# Patient Record
Sex: Female | Born: 1960 | Race: Black or African American | Hispanic: No | Marital: Married | Smoking: Never smoker
Health system: Southern US, Community
[De-identification: ages and names within clinical notes are randomized; demographics above are authoritative.]

## PROBLEM LIST (undated history)

## (undated) DIAGNOSIS — G479 Sleep disorder, unspecified: Secondary | ICD-10-CM

## (undated) DIAGNOSIS — G4733 Obstructive sleep apnea (adult) (pediatric): Secondary | ICD-10-CM

## (undated) DIAGNOSIS — R0789 Other chest pain: Secondary | ICD-10-CM

## (undated) DIAGNOSIS — U071 COVID-19: Secondary | ICD-10-CM

## (undated) DIAGNOSIS — R062 Wheezing: Secondary | ICD-10-CM

## (undated) DIAGNOSIS — G4719 Other hypersomnia: Secondary | ICD-10-CM

## (undated) DIAGNOSIS — I83813 Varicose veins of bilateral lower extremities with pain: Secondary | ICD-10-CM

## (undated) DIAGNOSIS — M5136 Other intervertebral disc degeneration, lumbar region: Secondary | ICD-10-CM

## (undated) DIAGNOSIS — R413 Other amnesia: Secondary | ICD-10-CM

## (undated) DIAGNOSIS — M51369 Other intervertebral disc degeneration, lumbar region without mention of lumbar back pain or lower extremity pain: Secondary | ICD-10-CM

## (undated) DIAGNOSIS — M62838 Other muscle spasm: Secondary | ICD-10-CM

## (undated) DIAGNOSIS — M47819 Spondylosis without myelopathy or radiculopathy, site unspecified: Secondary | ICD-10-CM

## (undated) DIAGNOSIS — I10 Essential (primary) hypertension: Secondary | ICD-10-CM

## (undated) HISTORY — DX: Other amnesia: R41.3

## (undated) HISTORY — DX: Obstructive sleep apnea (adult) (pediatric): G47.33

## (undated) HISTORY — DX: COVID-19: U07.1

## (undated) HISTORY — DX: Spondylosis without myelopathy or radiculopathy, site unspecified: M47.819

## (undated) HISTORY — DX: Varicose veins of bilateral lower extremities with pain: I83.813

## (undated) HISTORY — DX: Other intervertebral disc degeneration, lumbar region without mention of lumbar back pain or lower extremity pain: M51.369

## (undated) HISTORY — DX: Other chest pain: R07.89

## (undated) HISTORY — DX: Other hypersomnia: G47.19

## (undated) HISTORY — DX: Other muscle spasm: M62.838

## (undated) HISTORY — DX: Sleep disorder, unspecified: G47.9

## (undated) HISTORY — DX: Other intervertebral disc degeneration, lumbar region: M51.36

## (undated) HISTORY — DX: Wheezing: R06.2

---

## 1999-01-08 ENCOUNTER — Other Ambulatory Visit: Admission: RE | Admit: 1999-01-08 | Discharge: 1999-01-08 | Payer: Self-pay

## 1999-01-17 ENCOUNTER — Ambulatory Visit (HOSPITAL_COMMUNITY): Admission: RE | Admit: 1999-01-17 | Discharge: 1999-01-17 | Payer: Self-pay | Admitting: Family Medicine

## 1999-01-18 ENCOUNTER — Encounter: Payer: Self-pay | Admitting: Family Medicine

## 2002-04-01 ENCOUNTER — Encounter: Payer: Self-pay | Admitting: Obstetrics

## 2002-04-01 ENCOUNTER — Ambulatory Visit (HOSPITAL_COMMUNITY): Admission: RE | Admit: 2002-04-01 | Discharge: 2002-04-01 | Payer: Self-pay | Admitting: Obstetrics

## 2002-04-08 ENCOUNTER — Encounter: Admission: RE | Admit: 2002-04-08 | Discharge: 2002-04-08 | Payer: Self-pay | Admitting: Obstetrics

## 2002-04-08 ENCOUNTER — Encounter: Payer: Self-pay | Admitting: Obstetrics

## 2002-05-03 ENCOUNTER — Emergency Department (HOSPITAL_COMMUNITY): Admission: EM | Admit: 2002-05-03 | Discharge: 2002-05-03 | Payer: Self-pay | Admitting: Emergency Medicine

## 2003-02-22 ENCOUNTER — Emergency Department (HOSPITAL_COMMUNITY): Admission: EM | Admit: 2003-02-22 | Discharge: 2003-02-22 | Payer: Self-pay

## 2003-11-28 ENCOUNTER — Observation Stay (HOSPITAL_COMMUNITY): Admission: EM | Admit: 2003-11-28 | Discharge: 2003-11-29 | Payer: Self-pay | Admitting: *Deleted

## 2003-11-29 ENCOUNTER — Encounter: Payer: Self-pay | Admitting: Cardiology

## 2003-12-22 ENCOUNTER — Encounter: Admission: RE | Admit: 2003-12-22 | Discharge: 2003-12-22 | Payer: Self-pay | Admitting: Family Medicine

## 2004-07-14 IMAGING — US US ABDOMEN COMPLETE
1 series · 14 of 25 positions shown · non-contrast
Comparison: none

CLINICAL DATA: Severe mid abdominal pain.  Questionable gallbladder disease.
 COMPLETE ABDOMINAL ULTRASOUND
 Multiple scans of the entire abdomen are made and show both kidneys to be somewhat echogenic without evidence of thinning of renal cortex.  Right kidney is normal in size measuring 11.0 cm. The left kidney is normal in size measuring 12.1 cm.  The abdominal aorta is normal at 2.0 cm.  Inferior vena cava is normal.  The spleen is normal at 8.4 cm.  The gallbladder is normal and has maximal thickness of the wall of 2.8 mm.  The common bile duct is normal measuring 3.7 mm.  The liver is normal.  Pancreas is normal. 
 IMPRESSION
 Somewhat echogenic kidneys bilaterally without evidence of mass, hydronephrosis, or calcification.  Otherwise normal complete abdomen.

[Series 1: unknown · 14 of 56 slices shown]
[im 1/56]
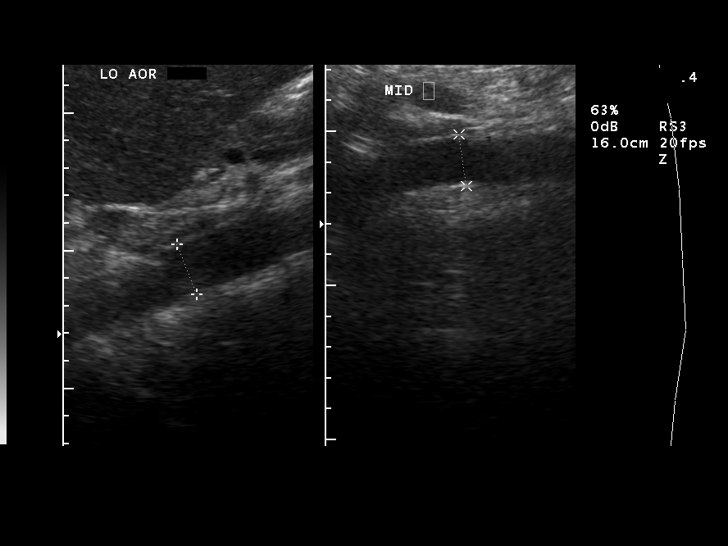
[im 5/56]
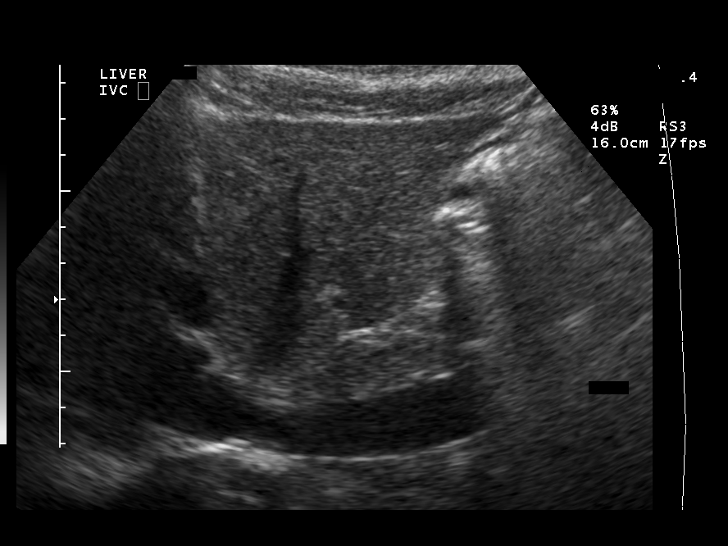
[im 10/56]
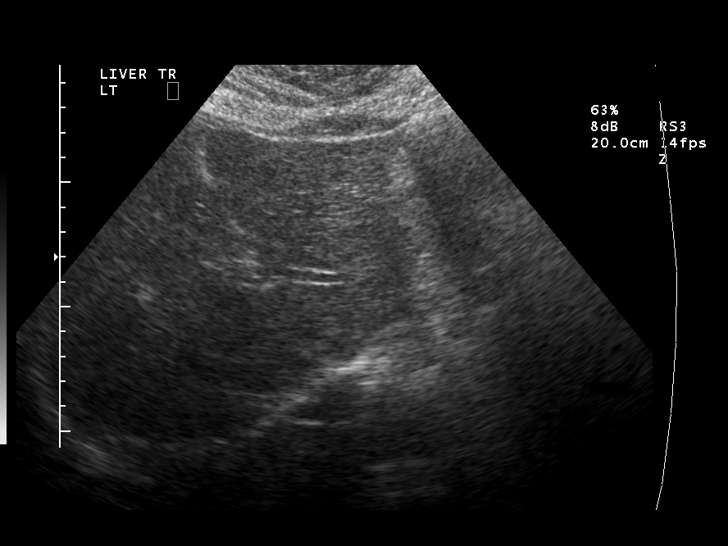
[im 14/56]
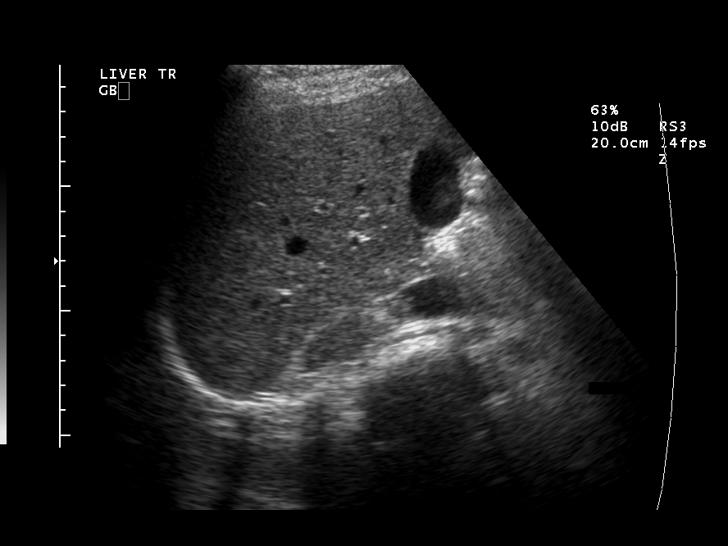
[im 19/56]
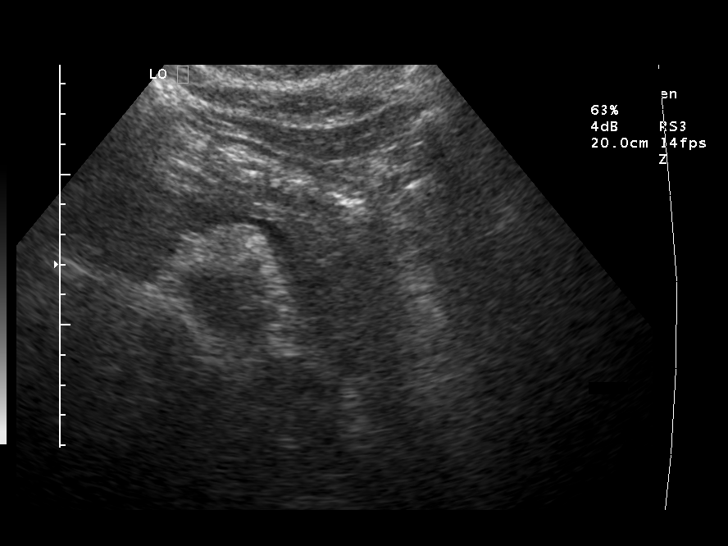
[im 21/56]
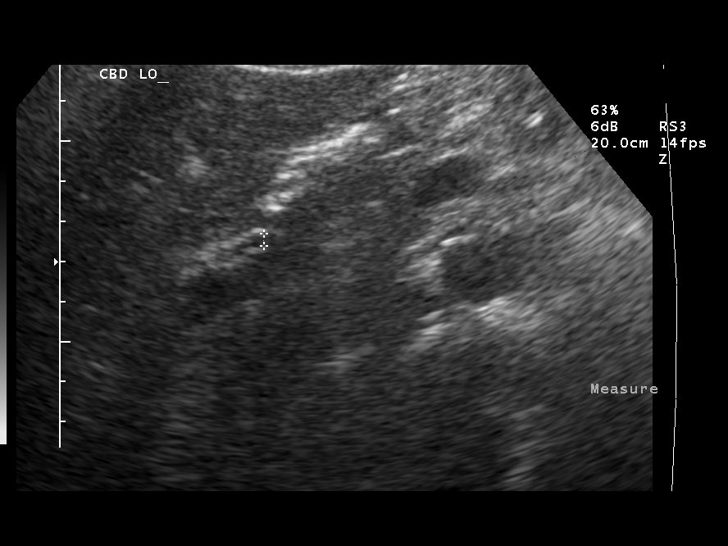
[im 26/56]
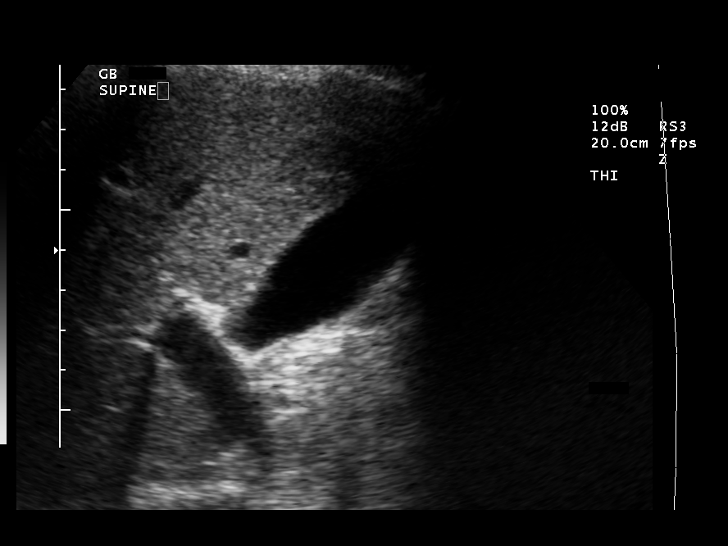
[im 30/56]
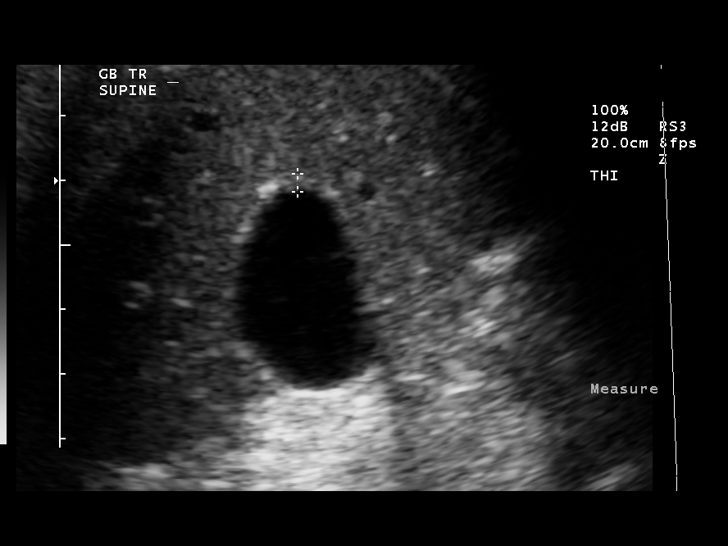
[im 35/56]
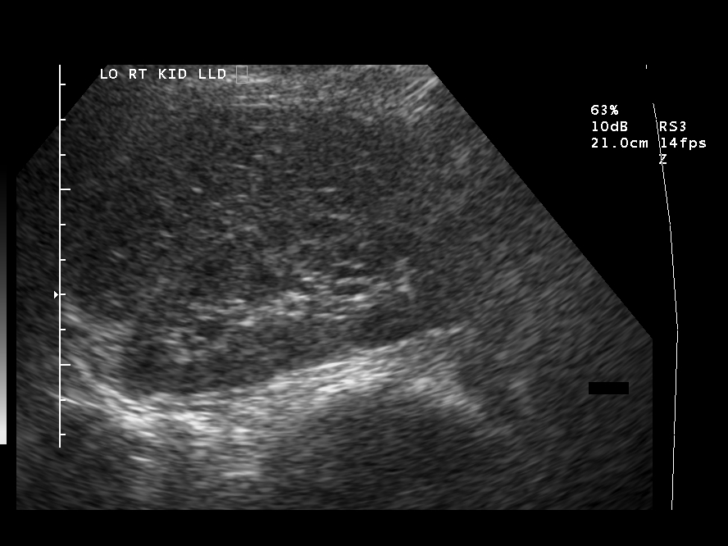
[im 37/56]
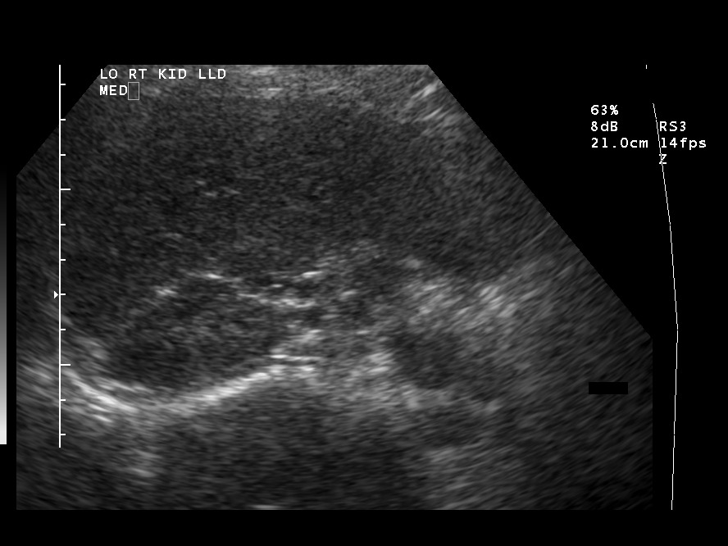
[im 42/56]
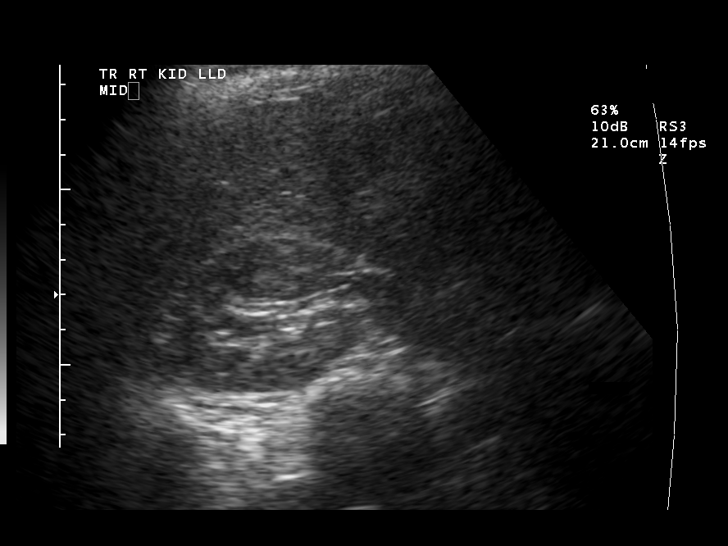
[im 46/56]
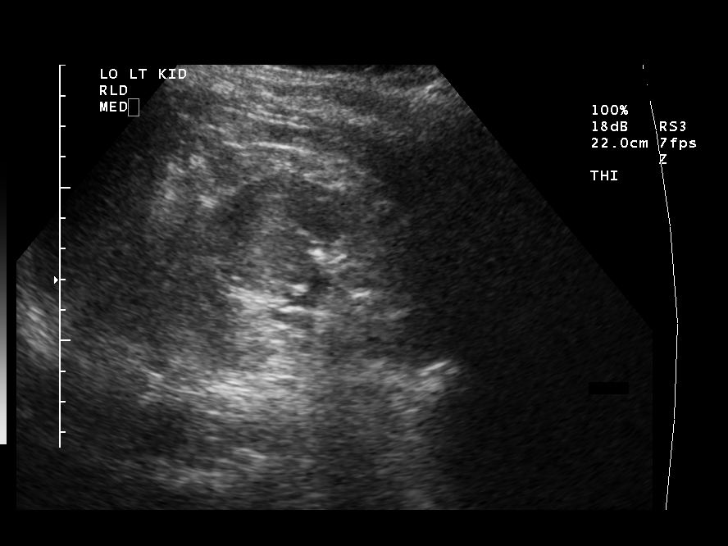
[im 51/56]
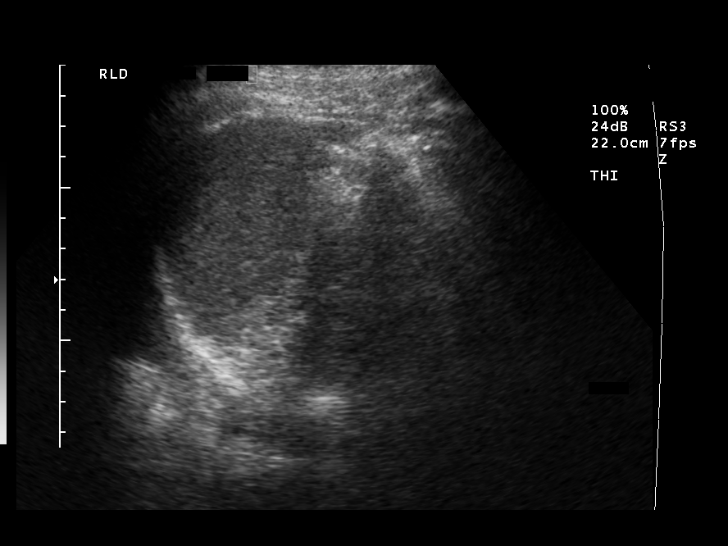
[im 56/56]
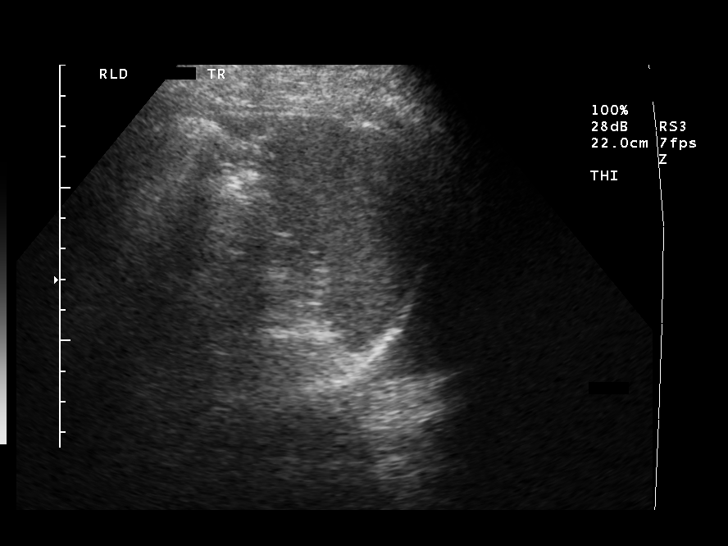

[14 of 25 positions shown; findings below may reference images not displayed]

## 2005-01-02 ENCOUNTER — Emergency Department (HOSPITAL_COMMUNITY): Admission: EM | Admit: 2005-01-02 | Discharge: 2005-01-02 | Payer: Self-pay | Admitting: Family Medicine

## 2006-04-01 ENCOUNTER — Ambulatory Visit: Payer: Self-pay | Admitting: Nurse Practitioner

## 2006-04-17 ENCOUNTER — Encounter: Admission: RE | Admit: 2006-04-17 | Discharge: 2006-04-17 | Payer: Self-pay | Admitting: Nurse Practitioner

## 2006-04-22 ENCOUNTER — Ambulatory Visit: Payer: Self-pay | Admitting: Nurse Practitioner

## 2006-04-22 ENCOUNTER — Ambulatory Visit: Payer: Self-pay | Admitting: *Deleted

## 2006-09-14 ENCOUNTER — Ambulatory Visit: Payer: Self-pay | Admitting: Nurse Practitioner

## 2006-09-17 ENCOUNTER — Ambulatory Visit (HOSPITAL_COMMUNITY): Admission: RE | Admit: 2006-09-17 | Discharge: 2006-09-17 | Payer: Self-pay | Admitting: Family Medicine

## 2006-09-17 ENCOUNTER — Encounter: Payer: Self-pay | Admitting: Vascular Surgery

## 2006-09-21 ENCOUNTER — Ambulatory Visit (HOSPITAL_COMMUNITY): Admission: RE | Admit: 2006-09-21 | Discharge: 2006-09-21 | Payer: Self-pay | Admitting: Nurse Practitioner

## 2006-10-08 ENCOUNTER — Ambulatory Visit: Payer: Self-pay | Admitting: Nurse Practitioner

## 2006-12-28 ENCOUNTER — Ambulatory Visit: Payer: Self-pay | Admitting: Nurse Practitioner

## 2007-02-12 ENCOUNTER — Ambulatory Visit: Payer: Self-pay | Admitting: Nurse Practitioner

## 2007-07-21 ENCOUNTER — Ambulatory Visit (HOSPITAL_BASED_OUTPATIENT_CLINIC_OR_DEPARTMENT_OTHER): Admission: RE | Admit: 2007-07-21 | Discharge: 2007-07-21 | Payer: Self-pay | Admitting: Orthopedic Surgery

## 2007-08-25 ENCOUNTER — Encounter (INDEPENDENT_AMBULATORY_CARE_PROVIDER_SITE_OTHER): Payer: Self-pay | Admitting: *Deleted

## 2007-09-06 ENCOUNTER — Ambulatory Visit: Payer: Self-pay | Admitting: Internal Medicine

## 2008-05-16 ENCOUNTER — Encounter (INDEPENDENT_AMBULATORY_CARE_PROVIDER_SITE_OTHER): Payer: Self-pay | Admitting: Nurse Practitioner

## 2008-05-16 ENCOUNTER — Ambulatory Visit: Payer: Self-pay | Admitting: Family Medicine

## 2008-05-16 LAB — CONVERTED CEMR LAB
Albumin: 4.2 g/dL (ref 3.5–5.2)
Alkaline Phosphatase: 64 units/L (ref 39–117)
CO2: 23 meq/L (ref 19–32)
Eosinophils Absolute: 0.1 10*3/uL (ref 0.0–0.7)
Glucose, Bld: 74 mg/dL (ref 70–99)
Lymphocytes Relative: 34 % (ref 12–46)
Lymphs Abs: 1.5 10*3/uL (ref 0.7–4.0)
Neutro Abs: 2.4 10*3/uL (ref 1.7–7.7)
Neutrophils Relative %: 56 % (ref 43–77)
Platelets: 221 10*3/uL (ref 150–400)
Potassium: 4.1 meq/L (ref 3.5–5.3)
Sodium: 138 meq/L (ref 135–145)
Total Protein: 7.2 g/dL (ref 6.0–8.3)
WBC: 4.3 10*3/uL (ref 4.0–10.5)

## 2008-05-22 ENCOUNTER — Ambulatory Visit (HOSPITAL_COMMUNITY): Admission: RE | Admit: 2008-05-22 | Discharge: 2008-05-22 | Payer: Self-pay | Admitting: Family Medicine

## 2008-08-08 ENCOUNTER — Ambulatory Visit: Payer: Self-pay | Admitting: Internal Medicine

## 2008-10-29 ENCOUNTER — Emergency Department (HOSPITAL_COMMUNITY): Admission: EM | Admit: 2008-10-29 | Discharge: 2008-10-29 | Payer: Self-pay | Admitting: Emergency Medicine

## 2010-07-17 ENCOUNTER — Emergency Department (HOSPITAL_COMMUNITY): Admission: EM | Admit: 2010-07-17 | Discharge: 2010-07-17 | Payer: Self-pay | Admitting: Family Medicine

## 2010-07-20 ENCOUNTER — Emergency Department (HOSPITAL_COMMUNITY): Admission: EM | Admit: 2010-07-20 | Discharge: 2010-07-20 | Payer: Self-pay | Admitting: Emergency Medicine

## 2010-07-20 ENCOUNTER — Emergency Department (HOSPITAL_COMMUNITY): Admission: EM | Admit: 2010-07-20 | Discharge: 2010-07-20 | Payer: Self-pay | Admitting: Family Medicine

## 2010-07-24 ENCOUNTER — Ambulatory Visit: Payer: Self-pay | Admitting: Internal Medicine

## 2010-08-15 ENCOUNTER — Ambulatory Visit: Payer: Self-pay | Admitting: Internal Medicine

## 2010-08-15 LAB — CONVERTED CEMR LAB
BUN: 24 mg/dL — ABNORMAL HIGH (ref 6–23)
Chloride: 106 meq/L (ref 96–112)
Creatinine, Ser: 1.02 mg/dL (ref 0.40–1.20)

## 2010-08-19 ENCOUNTER — Ambulatory Visit: Payer: Self-pay | Admitting: Internal Medicine

## 2010-12-29 ENCOUNTER — Encounter: Payer: Self-pay | Admitting: Family Medicine

## 2011-02-21 LAB — DIFFERENTIAL
Basophils Absolute: 0 10*3/uL (ref 0.0–0.1)
Basophils Relative: 0 % (ref 0–1)
Neutro Abs: 1.9 10*3/uL (ref 1.7–7.7)
Neutrophils Relative %: 50 % (ref 43–77)

## 2011-02-21 LAB — CBC
Hemoglobin: 12.4 g/dL (ref 12.0–15.0)
MCHC: 32.7 g/dL (ref 30.0–36.0)
Platelets: 198 10*3/uL (ref 150–400)
RDW: 13.8 % (ref 11.5–15.5)

## 2011-02-21 LAB — POCT CARDIAC MARKERS
Myoglobin, poc: 59.4 ng/mL (ref 12–200)
Myoglobin, poc: 61.7 ng/mL (ref 12–200)
Troponin i, poc: <0.05 ng/mL (ref 0.00–0.09)
Troponin i, poc: <0.05 ng/mL (ref 0.00–0.09)

## 2011-02-21 LAB — BASIC METABOLIC PANEL
BUN: 14 mg/dL (ref 6–23)
Calcium: 9.3 mg/dL (ref 8.4–10.5)
Creatinine, Ser: 0.79 mg/dL (ref 0.4–1.2)
GFR calc non Af Amer: >60 mL/min (ref >60)
Glucose, Bld: 94 mg/dL (ref 70–99)
Sodium: 139 mEq/L (ref 135–145)

## 2011-02-21 LAB — D-DIMER, QUANTITATIVE: D-Dimer, Quant: <0.22 ug/mL-FEU (ref 0.00–0.48)

## 2011-04-22 NOTE — Op Note (Signed)
NAMEDEVRA, STARE NO.:  [id number]   MEDICAL RECORD NO.:  [id number]          PATIENT TYPE:  AMB   LOCATION:  DSC                          FACILITY:  [facility]   PHYSICIAN:  Cindee Salt, M.D.       DATE OF BIRTH:  18-Jan-1961   DATE OF PROCEDURE:  07/21/2007  DATE OF DISCHARGE:                               OPERATIVE REPORT   PREOPERATIVE DIAGNOSIS:  Carpal tunnel syndrome right hand.   POSTOPERATIVE DIAGNOSIS:  Carpal tunnel syndrome right hand.   OPERATION:  Decompression right median nerve.   SURGEON:  Cindee Salt, M.D.   ASSISTANT:  Carolyne Fiscal R.N.   ANESTHESIA:  Forearm based IV regional.   ANESTHESIOLOGIST:  Guadalupe Maple, M.D.   HISTORY:  The patient is a 50 year old female referred by Duke Salvia  for consultation with respect to carpal tunnel syndrome, EMG nerve  conductions are positive.  This has not responded to conservative  treatment.  She has elect to proceed to have this surgically released.  She is aware of risks and complications including infection, recurrence,  injury to arteries, nerves, tendons incomplete relief of symptoms,  dystrophy.  Questions have been encouraged and answered to her  satisfaction.  In the preoperative area the patient is seen.  The  extremity marked by both the patient and surgeon, questions again  encouraged, antibiotic given.   PROCEDURE:  The patient is brought to the operating room where a forearm  based IV regional anesthetic was carried out without difficulty.  She  was prepped using DuraPrep, supine position, right arm free.  A  longitudinal incision was made, carried down through subcutaneous  tissue.  Bleeders were electrocauterized, palmar fascia was split,  superficial palmar arch identified, flexor tendon to the right little  finger identified to the ulnar side of median nerve.  Carpal retinaculum  was incised with sharp dissection.  A right-angle and Sewall retractor  were placed between skin and  forearm fascia.  The fascia released for  approximately 1.5 cm proximal to the wrist crease under direct vision.  The canal was explored.  Area compression to the nerve was apparent and  the tenosynovium tissue moderately thickened as was carpal retinaculum.  The wound was irrigated.  The skin was then closed interrupted 5-  0 Vicryl Rapide sutures.  Sterile compressive dressing and splint with  fingers free applied.  The patient tolerated the procedure well.  She is  taken to the recovery observation in satisfactory condition.  She is  discharged home to return to [facility] [place] in 1 week on  Vicodin.           ______________________________  Cindee Salt, M.D.     GK/MEDQ  D:  07/21/2007  T:  07/22/2007  Job:  [id number]   cc:   Duke Salvia

## 2011-04-25 NOTE — Discharge Summary (Signed)
Pamela Miles, Pamela Miles                        ACCOUNT NO.:  [id number]   MEDICAL RECORD NO.:  [id number]                   PATIENT TYPE:  OBV   LOCATION:  0382                                 FACILITY:  [facility]   PHYSICIAN:  Leonia Reeves, MD                 DATE OF BIRTH:  1961/07/24   DATE OF ADMISSION:  11/28/2003  DATE OF DISCHARGE:  11/29/2003                                 DISCHARGE SUMMARY   PRIMARY CARE PHYSICIAN:  None listed.   DISCHARGE DIAGNOSES:  1. Acute chest pain, unclear etiology.  2. Headache.   DISCHARGE MEDICATIONS:  1. Aspirin at 1 mg once daily.  2. Tylenol 650 mg q.4-6h. p.r.n. headache.   SUMMARY:  Pamela Miles is a 50 year old African American female with no  significant past medical history who presents in the ER with sudden onset of  left-sided chest pain radiating to the left arm.  The patient said the  problem started the previous night and chest pain was on and off. In the ER  the patient received sublingual nitroglycerin that she did not think  improved the chest pain, but which precipitated a headache to the patient.  She denied any history of fever, chills, cough, or sputum production. She  has no abdominal pain, no nausea, no vomiting, and no diarrhea.   Physical examination revealed a young [place] American female, well built,  well nourished, and in no acute distress. Blood pressure 140/85, pulse 73,  respirations 18, temperature 98.8. The patient was alert and oriented times  three. Head was normocephalic and atraumatic. Cardiovascular examination  revealed S1 and S2 normal. No S3, no S4. There was a soft grade 1/6 systolic  murmur that was nonradiating. Lungs were clear to auscultation and  percussion. The rest of the physical examination was unremarkable.   HOSPITAL COURSE:  The patient was admitted to the medical floor on a  telemetry bed and started on p.o. aspirin, low-dose Lopressor, Tylenol, and  also Protonix.  Cardiac enzymes were  drawn and EKGs were within normal  limits. Fasting blood sugar was also normal as well as CBC and BMP. Chest x-  ray showed no evidence of acute abnormality. A Cardiolite stress test was  done for further evaluation, the official report still pending at this time  though the patient said she was informed by the cardiologist that the test  was normal. The patient responded to the above regimen and the chest pain  resolved as well as the headache resolved.   DISPOSITION:  The patient is being discharged home today and has been  instructed to follow up with her primary care physician in seven to fourteen  days of discharge. The rationale for this discharge was discussed with  patient and she is agreeable.  Leonia Reeves, MD    VO/MEDQ  D:  11/29/2003  T:  11/29/2003  Job:  [id number]

## 2011-04-25 NOTE — H&P (Signed)
NAME:  Pamela Miles, Pamela Miles                        ACCOUNT NO.:  [id number]   MEDICAL RECORD NO.:  [id number]                   PATIENT TYPE:  EMS   LOCATION:  ED                                   FACILITY[facility]   PHYSICIAN:  Leonia Reeves, MD                 DATE OF BIRTH:  Jul 26, 1961   DATE OF ADMISSION:  11/28/2003  DATE OF DISCHARGE:                                HISTORY & PHYSICAL   PRIMARY CARE PHYSICIAN:  None listed.   HOSPITAL COURSE:  Chest pain.   HISTORY OF PRESENT ILLNESS:  Ms. Kiene is a 50 year old African-American  female with no significant past medical history who presents with sudden  onset of left-sided chest pain radiating to the left arm.  Patient said  problem started last night and chest pain has been on-and-off radiating to  the left arm.  She denied fever, chill, cough, and sputum production.  She  denied any trauma to the affected area.  There was no nausea, vomiting,  abdominal pain, and there was no diarrhea.  Patient denied history of such a  problem in the past.  She also denied history of heart attack in the family  except that she said the grandmother had a heart problem.  In ED the first  set of EKG and cardiac enzymes are within normal limits.   PAST MEDICAL HISTORY:  Non-significant.  Patient said she had laser surgery  for cancer of uterus.  She denied a history of hypertension,  hypercholesterolemia, diabetes mellitus, and also denied history of any  heart problem in the past.   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  She denied any drug abuse.  She specifically denied use of cocaine.  She  does not smoke and does not abuse alcohol.   ALLERGIES:  No known drug allergies.   FAMILY HISTORY:  Denied history of cardiac disease in the family.   CURRENT MEDICATIONS:  None listed.   REVIEW OF SYSTEMS:  CONSTITUTIONAL:  No fever.  Patient has headache which  she thinks is precipitated by nitroglycerin  that was given to her in the ED.  CARDIOVASCULAR:  Chest pain.  No shortness of breath.  No palpitation.  PULMONARY:  No cough, no sputum production, no dyspnea.  GIT:  No nausea, no  vomiting, no abdominal pain.  Other systems reviewed negative.   PHYSICAL EXAMINATION:  A young African-American female, well-built, well-  nourished, in no acute distress.  Blood pressure 140/85, pulse 73,  respiratory rate 18, temperature 98.8.  Patient is alert and oriented x3.  HEENT:  Head is normocephalic, atraumatic.  Pupils are equal, round,  reactive to light and accommodation.  Sclerae is anicteric.  Mucous  membranes moist.  Nares are patent and there is no oropharyngeal lesion.  NECK:  Supple without adenopathy.  CARDIOVASCULAR:  Normal S1 and S2, no S3, no S4.  No  rub.  There is soft  grade 1/6 systolic murmur.  RESPIRATORY:  Lungs are clear to auscultation and percussion.  Normal breath  sounds bilaterally.  CHEST WALL:  No chest wall tenderness.  GIT:  Abdomen is soft, nontender, no palpable mass.  Bowel sounds are  normal.  EXTREMITIES:  No edema, no cyanosis.  No __________.  There is normal range  of motion.  NEURO:  No focal neurological deficit.  Cranial nerves II-XII are grossly  intact.   LABORATORY DATA:  EKG and first set of cardiac enzymes are within normal  limits.  CBC, BMP, and urinalysis pending.   ASSESSMENT/PLAN:  A young African-American female with no known cardiac  history who now presents with left-sided chest pain of sudden onset.  The  first set of cardiac enzymes and EKG are within normal limits.  Patient to  be admitted to the telemetry for observation with the following diagnosis  and plan.   1. Acute chest pain.  Cardiac enzymes and EKG will be monitored.  Patient to     be started on low-dose Lopressor and aspirin.  If both cardiac enzymes     and EKG remain normal, patient will be scheduled for exercise stress     testing.   1. Headache.  Probably  precipitated by nitroglycerin.  Patient to be given     Tylenol.  Deep vein thrombosis prophylaxis will be done with subcutaneous     Lovenox and gastroesophageal prophylaxis will be done with p.o. Protonix.   The rationale for this admission for observation was discussed with the  patient and she is agreeable.                                               Leonia Reeves, MD    VO/MEDQ  D:  11/28/2003  T:  11/28/2003  Job:  [id number]

## 2011-07-31 ENCOUNTER — Other Ambulatory Visit (HOSPITAL_COMMUNITY): Payer: Self-pay | Admitting: Family Medicine

## 2011-07-31 ENCOUNTER — Other Ambulatory Visit: Payer: Self-pay | Admitting: Family Medicine

## 2011-07-31 DIAGNOSIS — Z1231 Encounter for screening mammogram for malignant neoplasm of breast: Secondary | ICD-10-CM

## 2011-08-06 ENCOUNTER — Ambulatory Visit (HOSPITAL_COMMUNITY): Payer: Self-pay | Attending: Family Medicine

## 2011-09-09 LAB — CBC
MCV: 88.2
Platelets: 218
RDW: 15
WBC: 5.3

## 2011-09-09 LAB — DIFFERENTIAL
Basophils Absolute: 0
Eosinophils Absolute: 0.1
Lymphocytes Relative: 35
Lymphs Abs: 1.8
Neutrophils Relative %: 53

## 2011-09-09 LAB — POCT I-STAT, CHEM 8
Creatinine, Ser: 1.1
Hemoglobin: 14.3
Potassium: 3.6
Sodium: 139

## 2011-09-09 LAB — POCT CARDIAC MARKERS
CKMB, poc: 5.7
Myoglobin, poc: 54.6
Myoglobin, poc: 72.4

## 2011-09-22 LAB — POCT HEMOGLOBIN-HEMACUE: Operator id: 112821

## 2012-02-17 ENCOUNTER — Emergency Department (HOSPITAL_COMMUNITY): Payer: Self-pay

## 2012-02-17 ENCOUNTER — Encounter (HOSPITAL_COMMUNITY): Payer: Self-pay

## 2012-02-17 ENCOUNTER — Emergency Department (HOSPITAL_COMMUNITY)
Admission: EM | Admit: 2012-02-17 | Discharge: 2012-02-17 | Disposition: A | Discharge status: Home / Self Care | Payer: Self-pay | Attending: Emergency Medicine | Admitting: Emergency Medicine

## 2012-02-17 DIAGNOSIS — M549 Dorsalgia, unspecified: Secondary | ICD-10-CM | POA: Insufficient documentation

## 2012-02-17 DIAGNOSIS — R059 Cough, unspecified: Secondary | ICD-10-CM | POA: Insufficient documentation

## 2012-02-17 DIAGNOSIS — I10 Essential (primary) hypertension: Secondary | ICD-10-CM | POA: Insufficient documentation

## 2012-02-17 DIAGNOSIS — J40 Bronchitis, not specified as acute or chronic: Secondary | ICD-10-CM | POA: Insufficient documentation

## 2012-02-17 DIAGNOSIS — R0602 Shortness of breath: Secondary | ICD-10-CM | POA: Insufficient documentation

## 2012-02-17 DIAGNOSIS — R05 Cough: Secondary | ICD-10-CM | POA: Insufficient documentation

## 2012-02-17 HISTORY — DX: Essential (primary) hypertension: I10

## 2012-02-17 MED ORDER — IBUPROFEN 800 MG PO TABS
800.0000 mg | ORAL_TABLET | Freq: Once | ORAL | Status: AC
  Administered 2012-02-17: 800 mg via ORAL
  Filled 2012-02-17: qty 1

## 2012-02-17 NOTE — ED Notes (Signed)
Patient reports that she developed cough, headache, chills x 3 days

## 2012-02-17 NOTE — ED Provider Notes (Signed)
Medical screening examination/treatment/procedure(s) were performed by non-physician practitioner and as supervising physician I was immediately available for consultation/collaboration.   Taylor Spilde, MD 02/17/12 1449 

## 2012-02-17 NOTE — Discharge Instructions (Signed)
Bronchitis Bronchitis is the body's way of reacting to injury and/or infection (inflammation) of the bronchi. Bronchi are the air tubes that extend from the windpipe into the lungs. If the inflammation becomes severe, it may cause shortness of breath. CAUSES  Inflammation may be caused by:  A virus.   Germs (bacteria).   Dust.   Allergens.   Pollutants and many other irritants.  The cells lining the bronchial tree are covered with tiny hairs (cilia). These constantly beat upward, away from the lungs, toward the mouth. This keeps the lungs free of pollutants. When these cells become too irritated and are unable to do their job, mucus begins to develop. This causes the characteristic cough of bronchitis. The cough clears the lungs when the cilia are unable to do their job. Without either of these protective mechanisms, the mucus would settle in the lungs. Then you would develop pneumonia. Smoking is a common cause of bronchitis and can contribute to pneumonia. Stopping this habit is the single most important thing you can do to help yourself. TREATMENT   Your caregiver may prescribe an antibiotic if the cough is caused by bacteria. Also, medicines that open up your airways make it easier to breathe. Your caregiver may also recommend or prescribe an expectorant. It will loosen the mucus to be coughed up. Only take over-the-counter or prescription medicines for pain, discomfort, or fever as directed by your caregiver.   Removing whatever causes the problem (smoking, for example) is critical to preventing the problem from getting worse.   Cough suppressants may be prescribed for relief of cough symptoms.   Inhaled medicines may be prescribed to help with symptoms now and to help prevent problems from returning.   For those with recurrent (chronic) bronchitis, there may be a need for steroid medicines.  SEEK IMMEDIATE MEDICAL CARE IF:   During treatment, you develop more pus-like mucus  (purulent sputum).   You have a fever.   Your baby is older than 3 months with a rectal temperature of 102 F (38.9 C) or higher.   Your baby is 87 months old or younger with a rectal temperature of 100.4 F (38 C) or higher.   You become progressively more ill.   You have increased difficulty breathing, wheezing, or shortness of breath.  It is necessary to seek immediate medical care if you are elderly or sick from any other disease. MAKE SURE YOU:   Understand these instructions.   Will watch your condition.   Will get help right away if you are not doing well or get worse.  Document Released: 11/24/2005 Document Revised: 11/13/2011 Document Reviewed: 10/03/2008 [facility] Patient Information 2012 [place], [place].         Antibiotic Nonuse  Your caregiver felt that the infection or problem was not one that would be helped with an antibiotic. Infections may be caused by viruses or bacteria. Only a caregiver can tell which one of these is the likely cause of an illness. A cold is the most common cause of infection in both adults and children. A cold is a virus. Antibiotic treatment will have no effect on a viral infection. Viruses can lead to many lost days of work caring for sick children and many missed days of school. Children may catch as many as 10 "colds" or "flus" per year during which they can be tearful, cranky, and uncomfortable. The goal of treating a virus is aimed at keeping the ill person comfortable. Antibiotics are medications used to help  the body fight bacterial infections. There are relatively few types of bacteria that cause infections but there are hundreds of viruses. While both viruses and bacteria cause infection they are very different types of germs. A viral infection will typically go away by itself within 7 to 10 days. Bacterial infections may spread or get worse without antibiotic treatment. Examples of bacterial infections are:  Sore throats (like strep  throat or tonsillitis).   Infection in the lung (pneumonia).   Ear and skin infections.  Examples of viral infections are:  Colds or flus.   Most coughs and bronchitis.   Sore throats not caused by Strep.   Runny noses.  It is often best not to take an antibiotic when a viral infection is the cause of the problem. Antibiotics can kill off the helpful bacteria that we have inside our body and allow harmful bacteria to start growing. Antibiotics can cause side effects such as allergies, nausea, and diarrhea without helping to improve the symptoms of the viral infection. Additionally, repeated uses of antibiotics can cause bacteria inside of our body to become resistant. That resistance can be passed onto harmful bacterial. The next time you have an infection it may be harder to treat if antibiotics are used when they are not needed. Not treating with antibiotics allows our own immune system to develop and take care of infections more efficiently. Also, antibiotics will work better for [place] when they are prescribed for bacterial infections. Treatments for a child that is ill may include:  Give extra fluids throughout the day to stay hydrated.   Get plenty of rest.   Only give your child over-the-counter or prescription medicines for pain, discomfort, or fever as directed by your caregiver.   The use of a cool mist humidifier may help stuffy noses.   Cold medications if suggested by your caregiver.  Your caregiver may decide to start you on an antibiotic if:  The problem you were seen for today continues for a longer length of time than expected.   You develop a secondary bacterial infection.  SEEK MEDICAL CARE IF:  Fever lasts longer than 5 days.   Symptoms continue to get worse after 5 to 7 days or become severe.   Difficulty in breathing develops.   Signs of dehydration develop (poor drinking, rare urinating, dark colored urine).   Changes in behavior or worsening tiredness  (listlessness or lethargy).  Document Released: 02/02/2002 Document Revised: 11/13/2011 Document Reviewed: 08/01/2009 [facility] Patient Information 2012 [place], [place].         Cool Mist Vaporizers Vaporizers may help relieve the symptoms of a cough and cold. By adding water to the air, mucus may become thinner and less sticky. This makes it easier to breathe and cough up secretions. Vaporizers have not been proven to show they help with colds. You should not use a vaporizer if you are allergic to mold. Cool mist vaporizers do not cause serious burns like hot mist vaporizers ("steamers"). HOME CARE INSTRUCTIONS  Follow the package instructions for your vaporizer.   Use a vaporizer that holds a large volume of water (1 to 2 gallons [5.7 to 7.5 liters]).   Do not use anything other than distilled water in the vaporizer.   Do not run the vaporizer all of the time. This can cause mold or bacteria to grow in the vaporizer.   Clean the vaporizer after each time you use it.   Clean and dry the vaporizer well before you store it.  Stop using a vaporizer if you develop worsening respiratory symptoms.  Document Released: 08/21/2004 Document Revised: 11/13/2011 Document Reviewed: 07/19/2009 [facility] Patient Information 2012 [place], [place].        Take ibuprofen as needed for your pain/chills.          If you have no primary doctor, here are some resources that may be helpful:  Medicaid-accepting [facility] Providers:   - [facility]- [address]      [phone]      Mon-Fri 9am-7pm, Sat 9am-1pm   - [facility]- [address] [place]      [phone]   - [facility]- [address], [address] [place]      [phone]   [facility]- [address]      [phone]   - Renaye Rakers- [address] [place], [address]      [phone]      Only accepts [place]  patients       after they have her name applied to their card   Self Pay (no insurance) in [place]:   - Sickle Cell Patients: Dr Willey Blade, [facility]      [address] [place]      [phone]   - Health [phone]   - Physician Referral Service- [phone]   - [facility]- [address] [place]      [phone]   [facility] [place]- [address]   - [facility]- see information above      (Speak to [employer] if you do not have insurance)   - [facility]- [address] [place]      [phone]   - [facility] [place]- [address]      [phone]   - [facility]- [address]      [phone]   - Dr Julio Sicks-  [address], [place]      [phone]   - [facility]- [address]      [phone]   - [facility]- [address]      [phone]      Also [address]      [phone]   - [facility]- [address]      [phone]      1st and 3rd Saturday every month, 10am-1pm Other agencies that provide inexpensive medical care:    [facility]  [phone]    [facility]  [phone]    [facility]  [phone]    [facility]  [phone]    [facility]  [phone]  General Information: Finding a doctor when you do not have health insurance can be tricky. Although you are not limited by an insurance plan, you are of course limited by her finances and how much but he can pay out of pocket.  What are your options if you don't have health insurance?   1) Find a Librarian, academic and Pay Out of Pocket Although you won't have to find out who is covered by your insurance plan, it is a good idea to ask around and get recommendations. You will then need to call the office and see if the doctor you have chosen will accept you as  a new patient and what types of options they offer for patients who are  self-pay. Some doctors offer discounts or will set up payment plans for their patients who do not have insurance, but you will need to ask so you aren't surprised when you get to your appointment.  2) Contact Your Local Health Department Not all health departments have doctors that can see patients for sick visits, but many do, so it is worth a call to see if yours does. If you don't know where your local health department is, you can check in your phone book. The CDC also has a tool to help you locate your state's health department, and many state websites also have listings of all of their local health departments.  3) Find a Walk-in Clinic If your illness is not likely to be very severe or complicated, you may want to try a walk in clinic. These are popping up all over the country in pharmacies, drugstores, and shopping centers. They're usually staffed by nurse practitioners or physician assistants that have been trained to treat common illnesses and complaints. They're usually fairly quick and inexpensive. However, if you have serious medical issues or chronic medical problems, these are probably not your best option

## 2012-02-17 NOTE — Progress Notes (Signed)
Pt listed as self pay with no insurance coverage Pt confirms he is self pay [place] resident with an [place] [employer] card that is effective until 03/10/12.  CM and [facility] coordinator spoke with her Pt offered [facility] services to assist with finding a [place] self pay provider

## 2012-02-17 NOTE — ED Provider Notes (Signed)
History     CSN: [id number]  Arrival date & time 02/17/12  1039   First MD Initiated Contact with Patient 02/17/12 1101      No chief complaint on file.   (Consider location/radiation/quality/duration/timing/severity/associated sxs/prior treatment) The history is provided by the patient.  51 y/o F with hx of HTN presents to ED with c/c of "flu symptoms" x 3 days. She has had chills, non-prod cough, mild SOB, right mid-back pain, and a mild HA. There has been no nasal congestion, ear pain, sore throat, neck pain or stiffness, CP, abd pain/ N/V, myalgias, dizziness, or lightheadedness. Pt has taken tylenol and aleve with temporary symptom relief. No prior medical eval for this complaint.  Past Medical History  Diagnosis Date  . Hypertension     History reviewed. No pertinent past surgical history.  No family history on file.  History  Substance Use Topics  . Smoking status: Never Smoker   . Smokeless tobacco: Not on file  . Alcohol Use: No     Review of Systems 10 systems reviewed and are negative for acute change except as noted in the HPI.  Allergies  Review of patient's allergies indicates no known allergies.  Home Medications   Current Outpatient Rx  Name Route Sig Dispense Refill  . ACETAMINOPHEN 500 MG PO TABS Oral Take 1,000 mg by mouth every 6 (six) hours as needed. pain    . GUAIFENESIN 100 MG/5ML PO SYRP Oral Take 200 mg by mouth 3 (three) times daily as needed. Chest congestion    . HYDROCHLOROTHIAZIDE 25 MG PO TABS Oral Take 25 mg by mouth daily.    Marland Kitchen VERAPAMIL HCL ER 240 MG PO TBCR Oral Take 240 mg by mouth at bedtime.      BP 121/69  Pulse 55  Temp 98.3 F (36.8 C)  Resp 16  SpO2 98%  Physical Exam  Nursing note and vitals reviewed. Constitutional: She is oriented to person, place, and time. She appears well-developed and well-nourished. No distress.  HENT:  Head: Normocephalic and atraumatic.  Right Ear: External ear normal.  Left Ear:  External ear normal.  Nose: Nose normal.  Mouth/Throat: Oropharynx is clear and moist. No oropharyngeal exudate.       Bilateral TM normal.  Eyes: Conjunctivae are normal. Pupils are equal, round, and reactive to light.  Neck: Normal range of motion. Neck supple.  Cardiovascular: Normal rate, regular rhythm, normal heart sounds and intact distal pulses.   Pulmonary/Chest: Breath sounds normal. No respiratory distress. She has no wheezes. She exhibits no tenderness.  Abdominal: Soft. Bowel sounds are normal. She exhibits no distension. There is no tenderness.  Musculoskeletal: Normal range of motion. She exhibits no edema.       Back:       Right lumbar back pain worse with trunk rotation, flexion, palpation. No midline tenderness, deformity, or palpable spasm.  Lymphadenopathy:    She has no cervical adenopathy.  Neurological: She is alert and oriented to person, place, and time. No cranial nerve deficit.  Skin: Skin is warm and dry.  Psychiatric: She has a normal mood and affect.    ED Course  Procedures (including critical care time)  Labs Reviewed - No data to display Dg Chest 2 View  02/17/2012  *RADIOLOGY REPORT*  Clinical Data: Cough and shortness of breath.  Chills.  CHEST - 2 VIEW  Comparison: 07/20/2010  Findings: The heart size and pulmonary vascularity are normal. Slight peribronchial thickening suggestive of bronchitis.  Lungs  are otherwise clear.  No effusions.  No osseous abnormality.  IMPRESSION: Minimal bronchitic changes.  Original Report Authenticated By: Gwynn Burly, M.D.     Dx 1: Bronchitis   MDM  CXR ordered to eval for pneumonia given chills, SOB, cough. Evidence of bronchitis. Back pain (which I suspect is muscular secondary to coughing) relieved with ibuprofen. Discussed symptomatic tx with patient, who voices understanding.        Shaaron Adler, [place] 02/17/12 1335

## 2012-03-24 ENCOUNTER — Emergency Department (HOSPITAL_COMMUNITY): Payer: No Typology Code available for payment source

## 2012-03-24 ENCOUNTER — Emergency Department (HOSPITAL_COMMUNITY)
Admission: EM | Admit: 2012-03-24 | Discharge: 2012-03-24 | Disposition: A | Discharge status: Home / Self Care | Payer: No Typology Code available for payment source | Attending: Emergency Medicine | Admitting: Emergency Medicine

## 2012-03-24 DIAGNOSIS — I10 Essential (primary) hypertension: Secondary | ICD-10-CM | POA: Insufficient documentation

## 2012-03-24 DIAGNOSIS — T148XXA Other injury of unspecified body region, initial encounter: Secondary | ICD-10-CM

## 2012-03-24 DIAGNOSIS — M25579 Pain in unspecified ankle and joints of unspecified foot: Secondary | ICD-10-CM | POA: Insufficient documentation

## 2012-03-24 DIAGNOSIS — S93699A Other sprain of unspecified foot, initial encounter: Secondary | ICD-10-CM | POA: Insufficient documentation

## 2012-03-24 DIAGNOSIS — M545 Low back pain, unspecified: Secondary | ICD-10-CM | POA: Insufficient documentation

## 2012-03-24 DIAGNOSIS — R51 Headache: Secondary | ICD-10-CM | POA: Insufficient documentation

## 2012-03-24 DIAGNOSIS — M542 Cervicalgia: Secondary | ICD-10-CM | POA: Insufficient documentation

## 2012-03-24 LAB — URINALYSIS, ROUTINE W REFLEX MICROSCOPIC
Ketones, ur: NEGATIVE mg/dL
Leukocytes, UA: NEGATIVE
Protein, ur: NEGATIVE mg/dL
Urobilinogen, UA: 1 mg/dL (ref 0.0–1.0)

## 2012-03-24 LAB — POCT PREGNANCY, URINE: Preg Test, Ur: NEGATIVE

## 2012-03-24 MED ORDER — CYCLOBENZAPRINE HCL 10 MG PO TABS
10.0000 mg | ORAL_TABLET | Freq: Once | ORAL | Status: AC
  Administered 2012-03-24: 10 mg via ORAL
  Filled 2012-03-24: qty 1

## 2012-03-24 MED ORDER — HYDROCODONE-ACETAMINOPHEN 5-325 MG PO TABS
1.0000 | ORAL_TABLET | Freq: Once | ORAL | Status: AC
  Administered 2012-03-24: 1 via ORAL
  Filled 2012-03-24: qty 1

## 2012-03-24 MED ORDER — CYCLOBENZAPRINE HCL 10 MG PO TABS: 10.0000 mg | ORAL_TABLET | Freq: Once | ORAL | Status: DC

## 2012-03-24 MED ORDER — HYDROCODONE-ACETAMINOPHEN 5-325 MG PO TABS: 1.0000 | ORAL_TABLET | Freq: Once | ORAL | Status: DC
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
## 2012-03-24 NOTE — ED Provider Notes (Signed)
Medical screening examination/treatment/procedure(s) were performed by non-physician practitioner and as supervising physician I was immediately available for consultation/collaboration.   Loren Racer, MD 03/24/12 2111
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
## 2012-03-24 NOTE — ED Notes (Signed)
Pt arrived [facility] on LSB and c-collar intact 
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
## 2012-03-28 ENCOUNTER — Encounter (HOSPITAL_COMMUNITY): Payer: Self-pay

## 2012-03-28 ENCOUNTER — Emergency Department (HOSPITAL_COMMUNITY)
Admission: EM | Admit: 2012-03-28 | Discharge: 2012-03-28 | Disposition: A | Discharge status: Home / Self Care | Payer: No Typology Code available for payment source | Attending: Emergency Medicine | Admitting: Emergency Medicine

## 2012-03-28 DIAGNOSIS — T148XXA Other injury of unspecified body region, initial encounter: Secondary | ICD-10-CM

## 2012-03-28 DIAGNOSIS — M542 Cervicalgia: Secondary | ICD-10-CM | POA: Insufficient documentation

## 2012-03-28 DIAGNOSIS — I10 Essential (primary) hypertension: Secondary | ICD-10-CM | POA: Insufficient documentation

## 2012-03-28 DIAGNOSIS — M545 Low back pain, unspecified: Secondary | ICD-10-CM | POA: Insufficient documentation

## 2012-03-28 MED ORDER — DIAZEPAM 5 MG PO TABS: 5.0000 mg | ORAL_TABLET | Freq: Two times a day (BID) | ORAL | Status: AC

## 2012-03-28 MED ORDER — OXYCODONE-ACETAMINOPHEN 5-325 MG PO TABS: 1.0000 | ORAL_TABLET | Freq: Four times a day (QID) | ORAL | Status: AC | PRN
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
## 2012-03-28 NOTE — Discharge Instructions (Signed)
Muscle Strain A muscle strain (pulled muscle) happens when a muscle is over-stretched. Recovery usually takes 5 to 6 weeks.  HOME CARE   Put ice on the injured area.   Put ice in a plastic bag.   Place a towel between your skin and the bag.   Leave the ice on for 15 to 20 minutes at a time, every hour for the first 2 days.   Do not use the muscle for several days or until your doctor says you can. Do not use the muscle if you have pain.   Wrap the injured area with an elastic bandage for comfort. Do not put it on too tightly.   Only take medicine as told by your doctor.   Warm up before exercise. This helps prevent muscle strains.  GET HELP RIGHT AWAY IF:  There is increased pain or puffiness (swelling) in the affected area. MAKE SURE YOU:   Understand these instructions.   Will watch your condition.   Will get help right away if you are not doing well or get worse.  Document Released: 09/02/2008 Document Revised: 11/13/2011 Document Reviewed: 09/02/2008 [employer] Patient Information 2012 [employer]. 
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
## 2012-04-27 ENCOUNTER — Ambulatory Visit (INDEPENDENT_AMBULATORY_CARE_PROVIDER_SITE_OTHER): Payer: PRIVATE HEALTH INSURANCE | Admitting: Family Medicine

## 2012-04-27 ENCOUNTER — Ambulatory Visit (HOSPITAL_BASED_OUTPATIENT_CLINIC_OR_DEPARTMENT_OTHER)
Admission: RE | Admit: 2012-04-27 | Discharge: 2012-04-27 | Disposition: A | Discharge status: Home / Self Care | Payer: No Typology Code available for payment source | Source: Ambulatory Visit | Attending: Family Medicine | Admitting: Family Medicine

## 2012-04-27 ENCOUNTER — Encounter: Payer: Self-pay | Admitting: Family Medicine

## 2012-04-27 VITALS — BP 124/78 | HR 60 | Temp 98.3°F | Ht 66.0 in | Wt 217.0 lb

## 2012-04-27 DIAGNOSIS — M549 Dorsalgia, unspecified: Secondary | ICD-10-CM

## 2012-04-27 DIAGNOSIS — S86012A Strain of left Achilles tendon, initial encounter: Secondary | ICD-10-CM

## 2012-04-27 DIAGNOSIS — M545 Low back pain, unspecified: Secondary | ICD-10-CM | POA: Insufficient documentation

## 2012-04-27 DIAGNOSIS — M542 Cervicalgia: Secondary | ICD-10-CM | POA: Insufficient documentation

## 2012-04-27 DIAGNOSIS — S3992XA Unspecified injury of lower back, initial encounter: Secondary | ICD-10-CM

## 2012-04-27 DIAGNOSIS — S0993XA Unspecified injury of face, initial encounter: Secondary | ICD-10-CM

## 2012-04-27 DIAGNOSIS — S199XXA Unspecified injury of neck, initial encounter: Secondary | ICD-10-CM

## 2012-04-27 DIAGNOSIS — IMO0002 Reserved for concepts with insufficient information to code with codable children: Secondary | ICD-10-CM

## 2012-04-27 DIAGNOSIS — S93499A Sprain of other ligament of unspecified ankle, initial encounter: Secondary | ICD-10-CM

## 2012-04-27 MED ORDER — HYDROCODONE-ACETAMINOPHEN 5-500 MG PO TABS: 1.0000 | ORAL_TABLET | Freq: Four times a day (QID) | ORAL | Status: AC | PRN

## 2012-04-27 MED ORDER — MELOXICAM 15 MG PO TABS: 15.0000 mg | ORAL_TABLET | Freq: Every day | ORAL | Status: DC

## 2012-04-27 MED ORDER — CYCLOBENZAPRINE HCL 10 MG PO TABS: 10.0000 mg | ORAL_TABLET | Freq: Three times a day (TID) | ORAL | Status: DC | PRN

## 2012-04-27 NOTE — Patient Instructions (Signed)
Given it's been over a month and you haven't made much improvement from your neck and back pain, we will move forward with MRIs to further assess for disc herniations. Continue working with Dr. Trey Paula in rehab, home program also. Take meloxicam daily with food for pain and inflammation. Vicodin as needed for severe pain (no driving on this medicine). Flexeril as needed for muscle spasms (no driving on this medicine if it makes you sleepy). Stay as active as possible. Strengthening of low back muscles, abdominal musculature are key for long term pain relief.  We will contact you with dates and times for the MRIs.  You strained your left achilles. Calf raises 3 sets of 10 once a day Can add heel walks, toe walks forward and backward as well Ice bucket 10-15 minutes at end of day - can ice 3-4 times a day. Avoid uneven ground, hills. Heel lifts will help unload this area during the day.  Follow up will be determined by your MRI results.

## 2012-04-29 ENCOUNTER — Encounter: Payer: Self-pay | Admitting: Family Medicine

## 2012-04-29 DIAGNOSIS — S86012A Strain of left Achilles tendon, initial encounter: Secondary | ICD-10-CM | POA: Insufficient documentation

## 2012-04-29 DIAGNOSIS — S199XXA Unspecified injury of neck, initial encounter: Secondary | ICD-10-CM | POA: Insufficient documentation

## 2012-04-29 DIAGNOSIS — S3992XA Unspecified injury of lower back, initial encounter: Secondary | ICD-10-CM | POA: Insufficient documentation
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
## 2012-05-27 ENCOUNTER — Emergency Department (HOSPITAL_BASED_OUTPATIENT_CLINIC_OR_DEPARTMENT_OTHER)
Admission: EM | Admit: 2012-05-27 | Discharge: 2012-05-27 | Disposition: A | Discharge status: Home / Self Care | Payer: No Typology Code available for payment source | Attending: Emergency Medicine | Admitting: Emergency Medicine

## 2012-05-27 ENCOUNTER — Encounter: Payer: Self-pay | Admitting: Family Medicine

## 2012-05-27 ENCOUNTER — Emergency Department (HOSPITAL_BASED_OUTPATIENT_CLINIC_OR_DEPARTMENT_OTHER): Payer: No Typology Code available for payment source

## 2012-05-27 ENCOUNTER — Encounter (HOSPITAL_BASED_OUTPATIENT_CLINIC_OR_DEPARTMENT_OTHER): Payer: Self-pay | Admitting: Family Medicine

## 2012-05-27 ENCOUNTER — Ambulatory Visit (INDEPENDENT_AMBULATORY_CARE_PROVIDER_SITE_OTHER): Payer: PRIVATE HEALTH INSURANCE | Admitting: Family Medicine

## 2012-05-27 VITALS — BP 127/82 | HR 65 | Temp 98.5°F | Ht 66.0 in | Wt 230.0 lb

## 2012-05-27 DIAGNOSIS — IMO0002 Reserved for concepts with insufficient information to code with codable children: Secondary | ICD-10-CM

## 2012-05-27 DIAGNOSIS — Z79899 Other long term (current) drug therapy: Secondary | ICD-10-CM | POA: Insufficient documentation

## 2012-05-27 DIAGNOSIS — S0993XA Unspecified injury of face, initial encounter: Secondary | ICD-10-CM

## 2012-05-27 DIAGNOSIS — R079 Chest pain, unspecified: Secondary | ICD-10-CM

## 2012-05-27 DIAGNOSIS — S3992XA Unspecified injury of lower back, initial encounter: Secondary | ICD-10-CM

## 2012-05-27 DIAGNOSIS — M542 Cervicalgia: Secondary | ICD-10-CM | POA: Insufficient documentation

## 2012-05-27 DIAGNOSIS — I10 Essential (primary) hypertension: Secondary | ICD-10-CM | POA: Insufficient documentation

## 2012-05-27 DIAGNOSIS — S199XXA Unspecified injury of neck, initial encounter: Secondary | ICD-10-CM

## 2012-05-27 DIAGNOSIS — Z87828 Personal history of other (healed) physical injury and trauma: Secondary | ICD-10-CM | POA: Insufficient documentation

## 2012-05-27 LAB — BASIC METABOLIC PANEL
CO2: 30 mEq/L (ref 19–32)
Calcium: 10.5 mg/dL (ref 8.4–10.5)
Chloride: 104 mEq/L (ref 96–112)
Potassium: 3.5 mEq/L (ref 3.5–5.1)
Sodium: 142 mEq/L (ref 135–145)

## 2012-05-27 LAB — CBC
Hemoglobin: 13.2 g/dL (ref 12.0–15.0)
MCH: 28.9 pg (ref 26.0–34.0)
Platelets: 244 10*3/uL (ref 150–400)
RBC: 4.56 MIL/uL (ref 3.87–5.11)
WBC: 5.3 10*3/uL (ref 4.0–10.5)

## 2012-05-27 LAB — TROPONIN I: Troponin I: <0.3 ng/mL (ref <0.30)

## 2012-05-27 MED ORDER — HYDROCODONE-ACETAMINOPHEN 5-325 MG PO TABS: 2.0000 | ORAL_TABLET | ORAL | Status: AC | PRN

## 2012-05-27 MED ORDER — IBUPROFEN 800 MG PO TABS: 800.0000 mg | ORAL_TABLET | Freq: Three times a day (TID) | ORAL | Status: DC
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
## 2012-05-27 NOTE — Patient Instructions (Addendum)
I recommend that you go to the emergency department to have your chest pain fully evaluated right now.  For your neck and low back continue the meloxicam with flexeril as needed for muscle spasms. Heat 15 minutes at a time 3-4 times a day. Keep doing your home exercises for low back and your neck at least 3 days a week until your pain has resolved. Massage and acupuncture are further considerations for your neck especially - they do massage in Dr. Lonell Face office down the hall on Tuesdays.  You can check with insurance to see if these are covered but they typically are not. You do have some arthritis in your low back that shots can be considered for - given this has largely improved I would not recommend that at this time - tylenol, meloxicam, flexeril, capsaicin (topical) are all medicines that can help with this without doing invasive treatment like the shots. It's ok for you to return to work but bend at knees when picking things up, keep computers at eye level, and use a chair with good back support. Follow up with me as needed.

## 2012-05-27 NOTE — ED Notes (Signed)
Pt brought down from Dr. Lazaro Arms office due to intermittent left sided chest pain since yesterday. Pt sts pain is sharp and only with movement or "real deep breath". Pt denies shob, n/v, cough.

## 2012-05-27 NOTE — ED Provider Notes (Signed)
Medical screening examination/treatment/procedure(s) were performed by non-physician practitioner and as supervising physician I was immediately available for consultation/collaboration.   Gwyneth Sprout, MD 05/27/12 [phone]

## 2012-05-27 NOTE — Discharge Instructions (Signed)
Chest Pain (Nonspecific) It is often hard to give a specific diagnosis for the cause of chest pain. There is always a chance that your pain could be related to something serious, such as a heart attack or a blood clot in the lungs. You need to follow up with your caregiver for further evaluation. CAUSES   Heartburn.   Pneumonia or bronchitis.   Anxiety or stress.   Inflammation around your heart (pericarditis) or lung (pleuritis or pleurisy).   A blood clot in the lung.   A collapsed lung (pneumothorax). It can develop suddenly on its own (spontaneous pneumothorax) or from injury (trauma) to the chest.   Shingles infection (herpes zoster virus).  The chest wall is composed of bones, muscles, and cartilage. Any of these can be the source of the pain.  The bones can be bruised by injury.   The muscles or cartilage can be strained by coughing or overwork.   The cartilage can be affected by inflammation and become sore (costochondritis).  DIAGNOSIS  Lab tests or other studies, such as X-rays, electrocardiography, stress testing, or cardiac imaging, may be needed to find the cause of your pain.  TREATMENT   Treatment depends on what may be causing your chest pain. Treatment may include:   Acid blockers for heartburn.   Anti-inflammatory medicine.   Pain medicine for inflammatory conditions.   Antibiotics if an infection is present.   You may be advised to change lifestyle habits. This includes stopping smoking and avoiding alcohol, caffeine, and chocolate.   You may be advised to keep your head raised (elevated) when sleeping. This reduces the chance of acid going backward from your stomach into your esophagus.   Most of the time, nonspecific chest pain will improve within 2 to 3 days with rest and mild pain medicine.  HOME CARE INSTRUCTIONS   If antibiotics were prescribed, take your antibiotics as directed. Finish them even if you start to feel better.   For the next few  days, avoid physical activities that bring on chest pain. Continue physical activities as directed.   Do not smoke.   Avoid drinking alcohol.   Only take over-the-counter or prescription medicine for pain, discomfort, or fever as directed by your caregiver.   Follow your caregiver's suggestions for further testing if your chest pain does not go away.   Keep any follow-up appointments you made. If you do not go to an appointment, you could develop lasting (chronic) problems with pain. If there is any problem keeping an appointment, you must call to reschedule.  SEEK MEDICAL CARE IF:   You think you are having problems from the medicine you are taking. Read your medicine instructions carefully.   Your chest pain does not go away, even after treatment.   You develop a rash with blisters on your chest.  SEEK IMMEDIATE MEDICAL CARE IF:   You have increased chest pain or pain that spreads to your arm, neck, jaw, back, or abdomen.   You develop shortness of breath, an increasing cough, or you are coughing up blood.   You have severe back or abdominal pain, feel nauseous, or vomit.   You develop severe weakness, fainting, or chills.   You have a fever.  THIS IS AN EMERGENCY. Do not wait to see if the pain will go away. Get medical help at once. Call your local emergency services (911 in [place] Do not drive yourself to the hospital. MAKE SURE YOU:   Understand these instructions.     Will watch your condition.   Will get help right away if you are not doing well or get worse.  Document Released: 09/03/2005 Document Revised: 11/13/2011 Document Reviewed: 06/29/2008 [employer] Patient Information 2012 [employer]. 

## 2012-05-28 ENCOUNTER — Encounter: Payer: Self-pay | Admitting: Family Medicine

## 2012-05-28 DIAGNOSIS — R079 Chest pain, unspecified: Secondary | ICD-10-CM | POA: Insufficient documentation

## 2012-05-28 DIAGNOSIS — R072 Precordial pain: Secondary | ICD-10-CM | POA: Insufficient documentation

## 2012-05-28 NOTE — Assessment & Plan Note (Signed)
Only risk factor for CAD is HTN but she was sitting on couch and had sudden onset left sided pain with shortness of breath and radiation to neck.  Taken down to ED after todays visit for full evaluation.
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
## 2012-06-29 ENCOUNTER — Encounter: Payer: Self-pay | Admitting: Family Medicine

## 2012-08-11 ENCOUNTER — Ambulatory Visit: Payer: Self-pay | Admitting: Family Medicine

## 2012-08-12 ENCOUNTER — Ambulatory Visit (INDEPENDENT_AMBULATORY_CARE_PROVIDER_SITE_OTHER): Payer: PRIVATE HEALTH INSURANCE | Admitting: Family Medicine

## 2012-08-12 VITALS — BP 112/73 | HR 65 | Ht 66.0 in | Wt 200.0 lb

## 2012-08-12 DIAGNOSIS — IMO0002 Reserved for concepts with insufficient information to code with codable children: Secondary | ICD-10-CM

## 2012-08-12 DIAGNOSIS — S199XXA Unspecified injury of neck, initial encounter: Secondary | ICD-10-CM

## 2012-08-12 DIAGNOSIS — S3992XA Unspecified injury of lower back, initial encounter: Secondary | ICD-10-CM

## 2012-08-12 DIAGNOSIS — R079 Chest pain, unspecified: Secondary | ICD-10-CM

## 2012-08-12 DIAGNOSIS — M25572 Pain in left ankle and joints of left foot: Secondary | ICD-10-CM

## 2012-08-12 DIAGNOSIS — S0993XA Unspecified injury of face, initial encounter: Secondary | ICD-10-CM

## 2012-08-12 DIAGNOSIS — M25579 Pain in unspecified ankle and joints of unspecified foot: Secondary | ICD-10-CM

## 2012-08-12 NOTE — Patient Instructions (Addendum)
You have left achilles tendinopathy Tylenol or aleve as needed for pain Lowering/raise on a step exercises 3 x 10 once or twice a day - two feet first then one (start with 3 x 6) Can add heel walks, toe walks forward and backward as well Ice bucket 10-15 minutes at end of day - can ice 3-4 times a day. Avoid uneven ground, hills as much as possible. Heel lifts  Consider nitro patches, physical therapy if not improving as expected. Follow up with me in 6 weeks.  For arthritis: Take tylenol 500mg  1-2 tabs three times a day for pain. Aleve 1-2 tabs twice a day with food Glucosamine sulfate 750mg  twice a day is a supplement that has been shown to help moderate to severe arthritis. Capsaicin topically up to four times a day may also help with pain.

## 2012-08-23 ENCOUNTER — Encounter: Payer: Self-pay | Admitting: Family Medicine

## 2012-08-23 DIAGNOSIS — M25572 Pain in left ankle and joints of left foot: Secondary | ICD-10-CM | POA: Insufficient documentation

## 2012-08-23 NOTE — Assessment & Plan Note (Signed)
2/2 achilles tendinopathy - Shown home exercise program and stretches.  Heel lifts provided.  Icing, tylenol as needed.  Avoid hills, uneven ground.  Consider PT, nitro patches if not improving as expected.
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
## 2012-08-23 NOTE — Assessment & Plan Note (Signed)
resolved 
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
## 2012-08-25 ENCOUNTER — Encounter (HOSPITAL_COMMUNITY): Payer: Self-pay | Admitting: Family Medicine

## 2012-08-25 ENCOUNTER — Emergency Department (HOSPITAL_COMMUNITY)
Admission: EM | Admit: 2012-08-25 | Discharge: 2012-08-25 | Disposition: A | Discharge status: Home / Self Care | Payer: Self-pay | Attending: Emergency Medicine | Admitting: Emergency Medicine

## 2012-08-25 DIAGNOSIS — Z833 Family history of diabetes mellitus: Secondary | ICD-10-CM | POA: Insufficient documentation

## 2012-08-25 DIAGNOSIS — I10 Essential (primary) hypertension: Secondary | ICD-10-CM | POA: Insufficient documentation

## 2012-08-25 DIAGNOSIS — R55 Syncope and collapse: Secondary | ICD-10-CM | POA: Insufficient documentation

## 2012-08-25 DIAGNOSIS — Z8241 Family history of sudden cardiac death: Secondary | ICD-10-CM | POA: Insufficient documentation

## 2012-08-25 DIAGNOSIS — M62838 Other muscle spasm: Secondary | ICD-10-CM | POA: Insufficient documentation

## 2012-08-25 DIAGNOSIS — Z8249 Family history of ischemic heart disease and other diseases of the circulatory system: Secondary | ICD-10-CM | POA: Insufficient documentation

## 2012-08-25 DIAGNOSIS — Z8489 Family history of other specified conditions: Secondary | ICD-10-CM | POA: Insufficient documentation

## 2012-08-25 LAB — POCT I-STAT, CHEM 8
BUN: 20 mg/dL (ref 6–23)
Hemoglobin: 13.6 g/dL (ref 12.0–15.0)
Potassium: 3.5 mEq/L (ref 3.5–5.1)
Sodium: 140 mEq/L (ref 135–145)
TCO2: 27 mmol/L (ref 0–100)

## 2012-08-25 NOTE — ED Notes (Signed)
Report given to Kim, RN.

## 2012-08-25 NOTE — ED Notes (Signed)
Pt has no complaints at this time but states that she had left leg cramping, hot flash with shortness of breath. Pt was breathing rapidly and then states she started having numbness and tingling in her hands. This episode lasted for approximately 30 minutes
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
## 2012-08-25 NOTE — ED Notes (Signed)
Old and new EKG given to Dr Yelverton 

## 2012-08-27 NOTE — ED Provider Notes (Signed)
History     CSN: [id number]  Arrival date & time 08/25/12  1842   First MD Initiated Contact with Patient 08/25/12 2131      Chief Complaint  Patient presents with  . Leg Pain  . Hot Flashes  . Blurred Vision    (Consider location/radiation/quality/duration/timing/severity/associated sxs/prior treatment) HPI Comments: Pamela Miles presents with sudden onset of left anterior thigh muscle cramping while she was sitting in her recliner chair at home.  She stood up to "walk off the cramp" and developed a hot flash (which she frequently has at least once daily associated with menopause) , she started taking very short, shallow breaths and then became lightheaded,  Her vision faded,  Her lips and finger tips became numb.  She sat down and her symptoms resolved.  The entire episode lasted about 15 minutes and occurred 3 hours before arrival here.  She has been symptom free since the event.  She denies having chest pain, cough, headache and has had no recent illness or fever.  She denies swelling or pain in her legs prior to the incident.  She does describe a history of frequent muscle spasm, but not in the past several months.  The history is provided by the patient.    Past Medical History  Diagnosis Date  . Hypertension     History reviewed. No pertinent past surgical history.  Family History  Problem Relation Age of Onset  . Hypertension Mother   . Sudden death Neg Hx   . Diabetes Neg Hx   . Heart attack Neg Hx   . Hyperlipidemia Neg Hx     History  Substance Use Topics  . Smoking status: Never Smoker   . Smokeless tobacco: Not on file  . Alcohol Use: No    OB History    Grav Para Term Preterm Abortions TAB SAB Ect Mult Living                  Review of Systems  Constitutional: Negative for fever.  HENT: Negative for congestion, sore throat and neck pain.   Eyes: Negative.   Respiratory: Negative for chest tightness.   Cardiovascular: Negative for chest pain,  palpitations and leg swelling.  Gastrointestinal: Negative for nausea and abdominal pain.  Genitourinary: Negative.   Musculoskeletal: Positive for myalgias. Negative for joint swelling and arthralgias.  Skin: Negative.  Negative for rash and wound.  Neurological: Positive for light-headedness and numbness. Negative for dizziness, weakness and headaches.  Hematological: Negative.   Psychiatric/Behavioral: Negative.     Allergies  Review of patient's allergies indicates no known allergies.  Home Medications   Current Outpatient Rx  Name Route Sig Dispense Refill  . Z-XTRA EX Oral Take 1 tablet by mouth daily.    Marland Kitchen HYDROCHLOROTHIAZIDE 25 MG PO TABS Oral Take 25 mg by mouth daily.    Marland Kitchen VERAPAMIL HCL ER 240 MG PO TBCR Oral Take 240 mg by mouth at bedtime.      BP 128/75  Pulse 63  Temp 97.8 F (36.6 C) (Oral)  Resp 21  SpO2 100%  Physical Exam  Nursing note and vitals reviewed. Constitutional: She is oriented to person, place, and time. She appears well-developed and well-nourished.  HENT:  Head: Normocephalic and atraumatic.  Eyes: Conjunctivae normal are normal.  Neck: Normal range of motion.  Cardiovascular: Normal rate, regular rhythm, normal heart sounds and intact distal pulses.   Pulses:      Dorsalis pedis pulses are 2+ on the  right side, and 2+ on the left side.  Pulmonary/Chest: Effort normal and breath sounds normal. She has no wheezes. She exhibits no tenderness.  Abdominal: Soft. Bowel sounds are normal. There is no tenderness.  Musculoskeletal: Normal range of motion. She exhibits no edema.       Left upper leg: She exhibits no tenderness, no swelling and no edema.  Neurological: She is alert and oriented to person, place, and time. No cranial nerve deficit or sensory deficit.  Reflex Scores:      Bicep reflexes are 2+ on the right side and 2+ on the left side.      Equal grip strength  Skin: Skin is warm and dry.  Psychiatric: She has a normal mood and  affect.    ED Course  Procedures (including critical care time)   Labs Reviewed  POCT I-STAT, CHEM 8  LAB REPORT - SCANNED   No results found.   1. Leg muscle spasm   2. Near syncope       MDM  Suspect pt had an episode of hyperventilation secondary to pain resulting in near syncope.  Labs,  Exam and ekg normal.  Pt vital signs stable and she remains asymptomatic while in ed.  No exam findings or risk factors for dvt/Pe.  Labs reviewed, no electrolyte abnormalities to explain muscle spasm, esp in light of pt on chronic diuretics.    Reassurance given,  Pt encouraged to f/u with pcp for a recheck or return here for worsened sx.       Date: 08/27/2012  Rate: 60  Rhythm: normal sinus rhythm  QRS Axis: normal  Intervals: normal  ST/T Wave abnormalities: normal  Conduction Disutrbances:none  Narrative Interpretation:   Old EKG Reviewed: unchanged    Burgess Amor, PA 08/27/12 1548

## 2012-08-31 NOTE — ED Provider Notes (Signed)
Medical screening examination/treatment/procedure(s) were performed by non-physician practitioner and as supervising physician I was immediately available for consultation/collaboration.   Loren Racer, MD 08/31/12 1705

## 2012-09-23 ENCOUNTER — Encounter: Payer: Self-pay | Admitting: Family Medicine

## 2012-09-23 ENCOUNTER — Ambulatory Visit: Payer: Self-pay | Admitting: Family Medicine

## 2012-09-23 ENCOUNTER — Ambulatory Visit (INDEPENDENT_AMBULATORY_CARE_PROVIDER_SITE_OTHER): Payer: PRIVATE HEALTH INSURANCE | Admitting: Family Medicine

## 2012-09-23 VITALS — BP 114/71 | HR 72 | Ht 66.0 in | Wt 200.0 lb

## 2012-09-23 DIAGNOSIS — S199XXA Unspecified injury of neck, initial encounter: Secondary | ICD-10-CM

## 2012-09-23 DIAGNOSIS — IMO0002 Reserved for concepts with insufficient information to code with codable children: Secondary | ICD-10-CM

## 2012-09-23 DIAGNOSIS — S0993XA Unspecified injury of face, initial encounter: Secondary | ICD-10-CM

## 2012-09-23 DIAGNOSIS — S3992XA Unspecified injury of lower back, initial encounter: Secondary | ICD-10-CM

## 2012-09-23 DIAGNOSIS — M25579 Pain in unspecified ankle and joints of unspecified foot: Secondary | ICD-10-CM

## 2012-09-23 DIAGNOSIS — M25572 Pain in left ankle and joints of left foot: Secondary | ICD-10-CM

## 2012-09-24 ENCOUNTER — Encounter: Payer: Self-pay | Admitting: Family Medicine
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
## 2012-09-24 NOTE — Assessment & Plan Note (Signed)
2/2 achilles tendinopathy - improved with HEP, heel lifts - given lower heel lifts to start using.  Consider PT, nitro patches in future but has had over 50% improvement with home program so will continue this until pain resolved.
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
## 2013-01-27 ENCOUNTER — Encounter: Payer: Self-pay | Admitting: Family Medicine

## 2013-01-27 ENCOUNTER — Ambulatory Visit (INDEPENDENT_AMBULATORY_CARE_PROVIDER_SITE_OTHER): Payer: PRIVATE HEALTH INSURANCE | Admitting: Family Medicine

## 2013-01-27 VITALS — BP 113/69 | HR 66 | Ht 66.0 in | Wt 207.0 lb

## 2013-01-27 DIAGNOSIS — M25572 Pain in left ankle and joints of left foot: Secondary | ICD-10-CM

## 2013-01-27 DIAGNOSIS — S3992XA Unspecified injury of lower back, initial encounter: Secondary | ICD-10-CM

## 2013-01-27 DIAGNOSIS — M7662 Achilles tendinitis, left leg: Secondary | ICD-10-CM

## 2013-01-27 DIAGNOSIS — M25579 Pain in unspecified ankle and joints of unspecified foot: Secondary | ICD-10-CM

## 2013-01-27 DIAGNOSIS — M7660 Achilles tendinitis, unspecified leg: Secondary | ICD-10-CM

## 2013-01-27 DIAGNOSIS — IMO0002 Reserved for concepts with insufficient information to code with codable children: Secondary | ICD-10-CM

## 2013-01-27 MED ORDER — NITROGLYCERIN 0.2 MG/HR TD PT24: MEDICATED_PATCH | TRANSDERMAL | Status: DC

## 2013-01-27 NOTE — Patient Instructions (Addendum)
Start physical therapy for your back and your achilles. Do home exercises on days you don't go to therapy. We will arrange for the injections in your low back and call you with date/time. Use 1/4th of a nitro patch over your left achilles.  Change this every day - take it off and put a new one on in a slightly different location. Follow up with me in 1 month.

## 2013-01-28 ENCOUNTER — Other Ambulatory Visit: Payer: Self-pay | Admitting: Family Medicine

## 2013-01-28 ENCOUNTER — Encounter: Payer: Self-pay | Admitting: Family Medicine

## 2013-01-28 DIAGNOSIS — M545 Low back pain, unspecified: Secondary | ICD-10-CM
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
## 2013-01-28 NOTE — Assessment & Plan Note (Signed)
2/2 achilles tendinopathy.  Continue HEP.  Add formal PT and nitro patches.  Discussed risks of headache, adhesive reaction.

## 2013-02-02 ENCOUNTER — Ambulatory Visit: Payer: No Typology Code available for payment source | Admitting: Physical Therapy

## 2013-02-03 ENCOUNTER — Ambulatory Visit
Admission: RE | Admit: 2013-02-03 | Discharge: 2013-02-03 | Disposition: A | Discharge status: Home / Self Care | Payer: No Typology Code available for payment source | Source: Ambulatory Visit | Attending: Family Medicine | Admitting: Family Medicine

## 2013-02-03 ENCOUNTER — Other Ambulatory Visit: Payer: Self-pay | Admitting: Family Medicine

## 2013-02-03 VITALS — BP 140/65 | HR 60

## 2013-02-03 DIAGNOSIS — M25572 Pain in left ankle and joints of left foot: Secondary | ICD-10-CM

## 2013-02-03 DIAGNOSIS — M545 Low back pain, unspecified: Secondary | ICD-10-CM

## 2013-02-03 MED ORDER — METHYLPREDNISOLONE ACETATE 40 MG/ML INJ SUSP (RADIOLOG
120.0000 mg | Freq: Once | INTRAMUSCULAR | Status: AC
  Administered 2013-02-03: 120 mg via INTRA_ARTICULAR

## 2013-02-03 MED ORDER — IOHEXOL 180 MG/ML  SOLN
1.0000 mL | Freq: Once | INTRAMUSCULAR | Status: AC | PRN
  Administered 2013-02-03: 1 mL via INTRA_ARTICULAR

## 2013-02-07 ENCOUNTER — Ambulatory Visit: Payer: No Typology Code available for payment source | Admitting: Physical Therapy

## 2013-02-15 ENCOUNTER — Ambulatory Visit: Payer: No Typology Code available for payment source | Attending: Family Medicine | Admitting: Physical Therapy

## 2013-02-15 DIAGNOSIS — M25579 Pain in unspecified ankle and joints of unspecified foot: Secondary | ICD-10-CM | POA: Insufficient documentation

## 2013-02-15 DIAGNOSIS — IMO0001 Reserved for inherently not codable concepts without codable children: Secondary | ICD-10-CM | POA: Insufficient documentation

## 2013-02-15 DIAGNOSIS — M542 Cervicalgia: Secondary | ICD-10-CM | POA: Insufficient documentation

## 2013-02-15 DIAGNOSIS — M545 Low back pain, unspecified: Secondary | ICD-10-CM | POA: Insufficient documentation

## 2013-02-18 ENCOUNTER — Ambulatory Visit: Payer: No Typology Code available for payment source | Attending: Family Medicine | Admitting: Physical Therapy

## 2013-02-18 DIAGNOSIS — M542 Cervicalgia: Secondary | ICD-10-CM | POA: Insufficient documentation

## 2013-02-18 DIAGNOSIS — M25579 Pain in unspecified ankle and joints of unspecified foot: Secondary | ICD-10-CM | POA: Insufficient documentation

## 2013-02-18 DIAGNOSIS — IMO0001 Reserved for inherently not codable concepts without codable children: Secondary | ICD-10-CM | POA: Insufficient documentation

## 2013-02-18 DIAGNOSIS — M545 Low back pain, unspecified: Secondary | ICD-10-CM | POA: Insufficient documentation

## 2013-02-22 ENCOUNTER — Ambulatory Visit: Payer: No Typology Code available for payment source | Attending: Family Medicine | Admitting: Physical Therapy

## 2013-02-22 DIAGNOSIS — M545 Low back pain, unspecified: Secondary | ICD-10-CM | POA: Insufficient documentation

## 2013-02-22 DIAGNOSIS — IMO0001 Reserved for inherently not codable concepts without codable children: Secondary | ICD-10-CM | POA: Insufficient documentation

## 2013-02-22 DIAGNOSIS — M25579 Pain in unspecified ankle and joints of unspecified foot: Secondary | ICD-10-CM | POA: Insufficient documentation

## 2013-02-22 DIAGNOSIS — M542 Cervicalgia: Secondary | ICD-10-CM | POA: Insufficient documentation

## 2013-02-23 ENCOUNTER — Ambulatory Visit (INDEPENDENT_AMBULATORY_CARE_PROVIDER_SITE_OTHER): Payer: PRIVATE HEALTH INSURANCE | Admitting: Family Medicine

## 2013-02-23 ENCOUNTER — Encounter: Payer: Self-pay | Admitting: Family Medicine

## 2013-02-23 VITALS — BP 110/70 | HR 64 | Ht 66.0 in | Wt 204.0 lb

## 2013-02-23 DIAGNOSIS — M25572 Pain in left ankle and joints of left foot: Secondary | ICD-10-CM

## 2013-02-23 DIAGNOSIS — M25579 Pain in unspecified ankle and joints of unspecified foot: Secondary | ICD-10-CM

## 2013-02-23 DIAGNOSIS — S199XXD Unspecified injury of neck, subsequent encounter: Secondary | ICD-10-CM

## 2013-02-23 DIAGNOSIS — Z5189 Encounter for other specified aftercare: Secondary | ICD-10-CM

## 2013-02-23 DIAGNOSIS — S3992XD Unspecified injury of lower back, subsequent encounter: Secondary | ICD-10-CM

## 2013-02-23 NOTE — Patient Instructions (Addendum)
You will probably need to do home exercises for your neck and back 2-3 times a week for the foreseeable future. Can continue with physical therapy as long as you're getting benefit with this. Continue with nitro patches for 2 more months - see me back at that time to reassess how you're doing. If your low back worsens you can do another series of shots in addition to your exercises - you can either call us to set that up or the radiologist directly if they gave you their contact information.

## 2013-02-24 ENCOUNTER — Encounter: Payer: Self-pay | Admitting: Family Medicine

## 2013-02-24 NOTE — Assessment & Plan Note (Signed)
2/2 muscle spasms of trapezius.  Heat, nsaids, HEP/PT, massage.
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
## 2013-02-24 NOTE — Assessment & Plan Note (Signed)
2/2 achilles tendinopathy.  Doing well with nitro patches - will continue through a 3 month course of this.  Continue PT and transition to a HEP.   F/u in 2 months.
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
## 2013-02-25 ENCOUNTER — Ambulatory Visit: Payer: No Typology Code available for payment source | Attending: Family Medicine | Admitting: Physical Therapy

## 2013-02-25 DIAGNOSIS — M545 Low back pain, unspecified: Secondary | ICD-10-CM | POA: Insufficient documentation

## 2013-02-25 DIAGNOSIS — M542 Cervicalgia: Secondary | ICD-10-CM | POA: Insufficient documentation

## 2013-02-25 DIAGNOSIS — IMO0001 Reserved for inherently not codable concepts without codable children: Secondary | ICD-10-CM | POA: Insufficient documentation

## 2013-02-25 DIAGNOSIS — M25579 Pain in unspecified ankle and joints of unspecified foot: Secondary | ICD-10-CM | POA: Insufficient documentation

## 2013-02-28 ENCOUNTER — Ambulatory Visit: Payer: No Typology Code available for payment source | Attending: Family Medicine | Admitting: Physical Therapy

## 2013-02-28 DIAGNOSIS — M25579 Pain in unspecified ankle and joints of unspecified foot: Secondary | ICD-10-CM | POA: Insufficient documentation

## 2013-02-28 DIAGNOSIS — M545 Low back pain, unspecified: Secondary | ICD-10-CM | POA: Insufficient documentation

## 2013-02-28 DIAGNOSIS — IMO0001 Reserved for inherently not codable concepts without codable children: Secondary | ICD-10-CM | POA: Insufficient documentation

## 2013-02-28 DIAGNOSIS — M542 Cervicalgia: Secondary | ICD-10-CM | POA: Insufficient documentation

## 2013-03-04 ENCOUNTER — Ambulatory Visit: Payer: No Typology Code available for payment source | Attending: Family Medicine | Admitting: Physical Therapy

## 2013-03-04 DIAGNOSIS — IMO0001 Reserved for inherently not codable concepts without codable children: Secondary | ICD-10-CM | POA: Insufficient documentation

## 2013-03-04 DIAGNOSIS — M545 Low back pain, unspecified: Secondary | ICD-10-CM | POA: Insufficient documentation

## 2013-03-04 DIAGNOSIS — M542 Cervicalgia: Secondary | ICD-10-CM | POA: Insufficient documentation

## 2013-03-04 DIAGNOSIS — M25579 Pain in unspecified ankle and joints of unspecified foot: Secondary | ICD-10-CM | POA: Insufficient documentation

## 2013-03-08 ENCOUNTER — Ambulatory Visit: Payer: No Typology Code available for payment source | Attending: Family Medicine | Admitting: Physical Therapy

## 2013-03-08 DIAGNOSIS — M545 Low back pain, unspecified: Secondary | ICD-10-CM | POA: Insufficient documentation

## 2013-03-08 DIAGNOSIS — M25579 Pain in unspecified ankle and joints of unspecified foot: Secondary | ICD-10-CM | POA: Insufficient documentation

## 2013-03-08 DIAGNOSIS — M542 Cervicalgia: Secondary | ICD-10-CM | POA: Insufficient documentation

## 2013-03-08 DIAGNOSIS — IMO0001 Reserved for inherently not codable concepts without codable children: Secondary | ICD-10-CM | POA: Insufficient documentation

## 2013-03-10 ENCOUNTER — Ambulatory Visit: Payer: No Typology Code available for payment source | Attending: Family Medicine | Admitting: Physical Therapy

## 2013-03-10 DIAGNOSIS — IMO0001 Reserved for inherently not codable concepts without codable children: Secondary | ICD-10-CM | POA: Insufficient documentation

## 2013-03-10 DIAGNOSIS — M545 Low back pain, unspecified: Secondary | ICD-10-CM | POA: Insufficient documentation

## 2013-03-10 DIAGNOSIS — M25579 Pain in unspecified ankle and joints of unspecified foot: Secondary | ICD-10-CM | POA: Insufficient documentation

## 2013-03-10 DIAGNOSIS — M542 Cervicalgia: Secondary | ICD-10-CM | POA: Insufficient documentation

## 2013-03-15 ENCOUNTER — Ambulatory Visit: Payer: No Typology Code available for payment source | Attending: Family Medicine | Admitting: Physical Therapy

## 2013-03-15 DIAGNOSIS — IMO0001 Reserved for inherently not codable concepts without codable children: Secondary | ICD-10-CM | POA: Insufficient documentation

## 2013-03-15 DIAGNOSIS — M545 Low back pain, unspecified: Secondary | ICD-10-CM | POA: Insufficient documentation

## 2013-03-15 DIAGNOSIS — M542 Cervicalgia: Secondary | ICD-10-CM | POA: Insufficient documentation

## 2013-03-15 DIAGNOSIS — M25579 Pain in unspecified ankle and joints of unspecified foot: Secondary | ICD-10-CM | POA: Insufficient documentation

## 2013-03-17 ENCOUNTER — Ambulatory Visit: Payer: No Typology Code available for payment source | Attending: Family Medicine | Admitting: Physical Therapy

## 2013-03-17 DIAGNOSIS — M25579 Pain in unspecified ankle and joints of unspecified foot: Secondary | ICD-10-CM | POA: Insufficient documentation

## 2013-03-17 DIAGNOSIS — IMO0001 Reserved for inherently not codable concepts without codable children: Secondary | ICD-10-CM | POA: Insufficient documentation

## 2013-03-17 DIAGNOSIS — M545 Low back pain, unspecified: Secondary | ICD-10-CM | POA: Insufficient documentation

## 2013-03-17 DIAGNOSIS — M542 Cervicalgia: Secondary | ICD-10-CM | POA: Insufficient documentation

## 2013-03-22 ENCOUNTER — Ambulatory Visit: Payer: No Typology Code available for payment source | Attending: Family Medicine | Admitting: Physical Therapy

## 2013-03-22 DIAGNOSIS — M545 Low back pain, unspecified: Secondary | ICD-10-CM | POA: Insufficient documentation

## 2013-03-22 DIAGNOSIS — IMO0001 Reserved for inherently not codable concepts without codable children: Secondary | ICD-10-CM | POA: Insufficient documentation

## 2013-03-22 DIAGNOSIS — M542 Cervicalgia: Secondary | ICD-10-CM | POA: Insufficient documentation

## 2013-03-22 DIAGNOSIS — M25579 Pain in unspecified ankle and joints of unspecified foot: Secondary | ICD-10-CM | POA: Insufficient documentation

## 2013-03-24 ENCOUNTER — Ambulatory Visit: Payer: No Typology Code available for payment source | Attending: Family Medicine | Admitting: Physical Therapy

## 2013-03-24 DIAGNOSIS — IMO0001 Reserved for inherently not codable concepts without codable children: Secondary | ICD-10-CM | POA: Insufficient documentation

## 2013-03-24 DIAGNOSIS — M545 Low back pain, unspecified: Secondary | ICD-10-CM | POA: Insufficient documentation

## 2013-03-24 DIAGNOSIS — M25579 Pain in unspecified ankle and joints of unspecified foot: Secondary | ICD-10-CM | POA: Insufficient documentation

## 2013-03-24 DIAGNOSIS — M542 Cervicalgia: Secondary | ICD-10-CM | POA: Insufficient documentation

## 2013-03-29 ENCOUNTER — Ambulatory Visit: Payer: No Typology Code available for payment source | Attending: Family Medicine | Admitting: Physical Therapy

## 2013-03-29 ENCOUNTER — Ambulatory Visit: Payer: No Typology Code available for payment source | Admitting: Physical Therapy

## 2013-03-29 DIAGNOSIS — M545 Low back pain, unspecified: Secondary | ICD-10-CM | POA: Insufficient documentation

## 2013-03-29 DIAGNOSIS — M542 Cervicalgia: Secondary | ICD-10-CM | POA: Insufficient documentation

## 2013-03-29 DIAGNOSIS — M25579 Pain in unspecified ankle and joints of unspecified foot: Secondary | ICD-10-CM | POA: Insufficient documentation

## 2013-03-29 DIAGNOSIS — IMO0001 Reserved for inherently not codable concepts without codable children: Secondary | ICD-10-CM | POA: Insufficient documentation

## 2013-04-01 ENCOUNTER — Ambulatory Visit: Payer: No Typology Code available for payment source | Attending: Family Medicine | Admitting: Physical Therapy

## 2013-04-01 DIAGNOSIS — M542 Cervicalgia: Secondary | ICD-10-CM | POA: Insufficient documentation

## 2013-04-01 DIAGNOSIS — M25579 Pain in unspecified ankle and joints of unspecified foot: Secondary | ICD-10-CM | POA: Insufficient documentation

## 2013-04-01 DIAGNOSIS — IMO0001 Reserved for inherently not codable concepts without codable children: Secondary | ICD-10-CM | POA: Insufficient documentation

## 2013-04-01 DIAGNOSIS — M545 Low back pain, unspecified: Secondary | ICD-10-CM | POA: Insufficient documentation

## 2013-04-05 ENCOUNTER — Ambulatory Visit: Payer: No Typology Code available for payment source | Admitting: Physical Therapy

## 2013-04-08 ENCOUNTER — Ambulatory Visit: Payer: No Typology Code available for payment source | Attending: Family Medicine | Admitting: Physical Therapy

## 2013-04-08 DIAGNOSIS — M25579 Pain in unspecified ankle and joints of unspecified foot: Secondary | ICD-10-CM | POA: Insufficient documentation

## 2013-04-08 DIAGNOSIS — IMO0001 Reserved for inherently not codable concepts without codable children: Secondary | ICD-10-CM | POA: Insufficient documentation

## 2013-04-08 DIAGNOSIS — M545 Low back pain, unspecified: Secondary | ICD-10-CM | POA: Insufficient documentation

## 2013-04-08 DIAGNOSIS — M542 Cervicalgia: Secondary | ICD-10-CM | POA: Insufficient documentation

## 2013-04-11 ENCOUNTER — Ambulatory Visit: Payer: No Typology Code available for payment source | Admitting: Physical Therapy

## 2013-04-14 ENCOUNTER — Ambulatory Visit: Payer: No Typology Code available for payment source | Admitting: Physical Therapy

## 2013-04-25 ENCOUNTER — Ambulatory Visit: Payer: Self-pay | Admitting: Family Medicine

## 2013-04-26 ENCOUNTER — Encounter: Payer: Self-pay | Admitting: Family Medicine

## 2013-04-26 ENCOUNTER — Ambulatory Visit (INDEPENDENT_AMBULATORY_CARE_PROVIDER_SITE_OTHER): Payer: PRIVATE HEALTH INSURANCE | Admitting: Family Medicine

## 2013-04-26 VITALS — BP 120/77 | HR 71 | Ht 66.0 in | Wt 200.0 lb

## 2013-04-26 DIAGNOSIS — M549 Dorsalgia, unspecified: Secondary | ICD-10-CM

## 2013-04-26 DIAGNOSIS — M7660 Achilles tendinitis, unspecified leg: Secondary | ICD-10-CM

## 2013-04-26 DIAGNOSIS — M25579 Pain in unspecified ankle and joints of unspecified foot: Secondary | ICD-10-CM

## 2013-04-26 DIAGNOSIS — M25572 Pain in left ankle and joints of left foot: Secondary | ICD-10-CM

## 2013-04-26 DIAGNOSIS — S199XXD Unspecified injury of neck, subsequent encounter: Secondary | ICD-10-CM

## 2013-04-26 DIAGNOSIS — M7662 Achilles tendinitis, left leg: Secondary | ICD-10-CM

## 2013-04-26 DIAGNOSIS — Z5189 Encounter for other specified aftercare: Secondary | ICD-10-CM

## 2013-04-26 MED ORDER — CYCLOBENZAPRINE HCL 10 MG PO TABS: 10.0000 mg | ORAL_TABLET | Freq: Three times a day (TID) | ORAL | Status: DC | PRN

## 2013-04-26 MED ORDER — NITROGLYCERIN 0.2 MG/HR TD PT24: MEDICATED_PATCH | TRANSDERMAL | Status: DC

## 2013-04-26 NOTE — Patient Instructions (Addendum)
Continue with home exercises for your achilles and your back. When you get cramps you should try drinking lots of water, try mustard, pickle juice, and/or pretzels. Flexeril as needed for cramps/spasms (can make you sleepy though). Stretch out the muscle that is cramping when this happens too. Try massage for your upper back. Follow up with me in 3 months.
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
## 2013-04-27 NOTE — Assessment & Plan Note (Signed)
2/2 muscle spasms of trapezius.  Heat, nsaids, HEP.  Encouraged she try massage, possibly active release.  No findings on MRI to warrant going ahead with surgical referral or ESIs.

## 2013-04-27 NOTE — Assessment & Plan Note (Signed)
2/2 achilles tendinopathy.  By further report she was using full patches for about a month.  Re-reviewed proper usage of 1/4th of a patch and plan to use for 3 months.  Heel lifts, home exercises.  If not improving would consider surgical referral.

## 2013-07-01 ENCOUNTER — Encounter: Payer: Self-pay | Admitting: Family Medicine

## 2013-07-27 ENCOUNTER — Ambulatory Visit (INDEPENDENT_AMBULATORY_CARE_PROVIDER_SITE_OTHER): Payer: PRIVATE HEALTH INSURANCE | Admitting: Family Medicine

## 2013-07-27 ENCOUNTER — Encounter: Payer: Self-pay | Admitting: Family Medicine

## 2013-07-27 VITALS — BP 125/77 | HR 68 | Ht 65.0 in | Wt 200.0 lb

## 2013-07-27 DIAGNOSIS — M25579 Pain in unspecified ankle and joints of unspecified foot: Secondary | ICD-10-CM

## 2013-07-27 DIAGNOSIS — S199XXD Unspecified injury of neck, subsequent encounter: Secondary | ICD-10-CM

## 2013-07-27 DIAGNOSIS — M549 Dorsalgia, unspecified: Secondary | ICD-10-CM

## 2013-07-27 DIAGNOSIS — M25572 Pain in left ankle and joints of left foot: Secondary | ICD-10-CM

## 2013-07-27 DIAGNOSIS — Z5189 Encounter for other specified aftercare: Secondary | ICD-10-CM

## 2013-07-27 MED ORDER — CYCLOBENZAPRINE HCL 10 MG PO TABS: 10.0000 mg | ORAL_TABLET | Freq: Three times a day (TID) | ORAL | Status: DC | PRN

## 2013-07-27 NOTE — Patient Instructions (Addendum)
Continue with home exercises for your achilles and your back. Flexeril as needed for cramps/spasms (can make you sleepy though). Consider active release for your upper back/neck - Thereasa Distance at [facility] is who I typically recommend - [phone] Call if you want to go ahead with surgery for your achilles. Use the patches usually for 3 more months (for the achilles). Follow up with me as needed.

## 2013-07-28 ENCOUNTER — Encounter: Payer: Self-pay | Admitting: Family Medicine

## 2013-07-28 NOTE — Assessment & Plan Note (Signed)
2/2 achilles tendinopathy.  We discussed she has tried all conservative measures that would help with this.  Offered surgical referral which she declined today - will call us if she changes her mind.  Can continue using nitro patches until she is 6 months out.  Heel lifts and home exercises as well.

## 2013-07-28 NOTE — Assessment & Plan Note (Signed)
2/2 muscle spasms of trapezius.  Heat, nsaids, HEP.  Again given number for active release to consider this.  No findings on MRI to warrant going ahead with surgical referral or ESIs.
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
## 2013-09-17 ENCOUNTER — Encounter (HOSPITAL_COMMUNITY): Payer: Self-pay | Admitting: Emergency Medicine

## 2013-09-17 ENCOUNTER — Emergency Department (HOSPITAL_COMMUNITY)
Admission: EM | Admit: 2013-09-17 | Discharge: 2013-09-18 | Disposition: A | Discharge status: Home / Self Care | Payer: Self-pay | Attending: Emergency Medicine | Admitting: Emergency Medicine

## 2013-09-17 DIAGNOSIS — R630 Anorexia: Secondary | ICD-10-CM | POA: Insufficient documentation

## 2013-09-17 DIAGNOSIS — Z79899 Other long term (current) drug therapy: Secondary | ICD-10-CM | POA: Insufficient documentation

## 2013-09-17 DIAGNOSIS — R05 Cough: Secondary | ICD-10-CM | POA: Insufficient documentation

## 2013-09-17 DIAGNOSIS — B9789 Other viral agents as the cause of diseases classified elsewhere: Secondary | ICD-10-CM | POA: Insufficient documentation

## 2013-09-17 DIAGNOSIS — I10 Essential (primary) hypertension: Secondary | ICD-10-CM | POA: Insufficient documentation

## 2013-09-17 DIAGNOSIS — R059 Cough, unspecified: Secondary | ICD-10-CM | POA: Insufficient documentation

## 2013-09-17 DIAGNOSIS — B349 Viral infection, unspecified: Secondary | ICD-10-CM

## 2013-09-17 LAB — RAPID STREP SCREEN (MED CTR MEBANE ONLY): Streptococcus, Group A Screen (Direct): NEGATIVE

## 2013-09-17 MED ORDER — IBUPROFEN 200 MG PO TABS
600.0000 mg | ORAL_TABLET | Freq: Once | ORAL | Status: AC
  Administered 2013-09-17: 600 mg via ORAL
  Filled 2013-09-17: qty 3

## 2013-09-17 NOTE — ED Notes (Signed)
Pt arrived to ED with a complaint of a sore throat and cough.  Pt has had symptoms since Tuesday.  Pt states that she has had diarrhea for two days.

## 2013-09-17 NOTE — ED Provider Notes (Signed)
CSN: [id number]     Arrival date & time 09/17/13  2207 History  This chart was scribed for non-physician practitioner, Earley Favor, FNP,working with Vida Roller, MD, by Karle Plumber, ED Scribe.  This patient was seen in room [facility] and the patient's care was started at 10:38 PM.  Chief Complaint  Patient presents with  . Sore Throat  . Cough   The history is provided by the patient. No language interpreter was used.   HPI Comments:  Pamela Miles is a 52 y.o. female who presents to the Emergency Department complaining of constant sore throat onset 4 days. She states she has been gargling warm salt water, drinking hot tea, and taking Tylenol X1,  2 days ago for the pain. She reports an associated sore throat and diarrhea yesterday and this morning but none since.  She reports 3-4 loose bowels last night and 1-2 today. She reports a decrease in appetite, but has been drinking plenty of fluids. Pt states her daughter recently had a sore throat and her granddaughter attends daycare. Pt denies rhinorrhea.   Past Medical History  Diagnosis Date  . Hypertension    History reviewed. No pertinent past surgical history. Family History  Problem Relation Age of Onset  . Hypertension Mother   . Sudden death Neg Hx   . Diabetes Neg Hx   . Heart attack Neg Hx   . Hyperlipidemia Neg Hx    History  Substance Use Topics  . Smoking status: Never Smoker   . Smokeless tobacco: Not on file  . Alcohol Use: No   OB History   Grav Para Term Preterm Abortions TAB SAB Ect Mult Living                 Review of Systems  Constitutional: Negative for fever and chills.  HENT: Positive for sore throat. Negative for postnasal drip and rhinorrhea.   Respiratory: Positive for cough. Negative for shortness of breath and wheezing.   Musculoskeletal: Negative for myalgias and neck pain.  All other systems reviewed and are negative.    Allergies  Review of patient's allergies indicates no known  allergies.  Home Medications   Current Outpatient Rx  Name  Route  Sig  Dispense  Refill  . acetaminophen (TYLENOL) 500 MG tablet   Oral   Take 500 mg by mouth every 6 (six) hours as needed for pain.         . cyclobenzaprine (FLEXERIL) 10 MG tablet   Oral   Take 10 mg by mouth 3 (three) times daily as needed for muscle spasms.         Marland Kitchen guaiFENesin (MUCINEX) 600 MG 12 hr tablet   Oral   Take 1,200 mg by mouth 2 (two) times daily as needed for congestion.         Marland Kitchen ibuprofen (ADVIL,MOTRIN) 200 MG tablet   Oral   Take 600 mg by mouth every 8 (eight) hours as needed for pain.         Marland Kitchen lisinopril-hydrochlorothiazide (PRINZIDE,ZESTORETIC) 20-25 MG per tablet   Oral   Take 1 tablet by mouth every morning.         . nitroGLYCERIN (NITRODUR - DOSED IN MG/24 HR) 0.2 mg/hr      Apply 1/2 of a patch to affected Achilles tendon - change daily   30 patch   1     Please cut each patch in half for patient.   . verapamil (CALAN-SR) 240 MG  CR tablet   Oral   Take 240 mg by mouth at bedtime.         Marland Kitchen ibuprofen (ADVIL,MOTRIN) 600 MG tablet   Oral   Take 1 tablet (600 mg total) by mouth every 6 (six) hours as needed for pain.   30 tablet   0    Triage Vitals: BP 129/75  Pulse 86  Temp(Src) 98.9 F (37.2 C) (Oral)  Resp 20  Wt 209 lb (94.802 kg)  BMI 34.78 kg/m2  SpO2 97% Physical Exam  Nursing note and vitals reviewed. Constitutional: She appears well-developed.  HENT:  Head: Normocephalic.  Eyes: Pupils are equal, round, and reactive to light.  Neck: Normal range of motion.  Cardiovascular: Normal rate and regular rhythm.   Pulmonary/Chest: Effort normal and breath sounds normal.  Musculoskeletal: Normal range of motion.  Lymphadenopathy:    She has no cervical adenopathy.  Neurological: She is alert.  Skin: Skin is warm. No rash noted.    ED Course  Procedures (including critical care time) DIAGNOSTIC STUDIES: Oxygen Saturation is 97% on RA, normal  by my interpretation.   COORDINATION OF CARE: 10:46 PM- Will do a strep test and give pain medication. Pt verbalizes understanding and agrees to plan.  Medications  ibuprofen (ADVIL,MOTRIN) tablet 600 mg (600 mg Oral Given 09/17/13 2251)   Labs Review Labs Reviewed  RAPID STREP SCREEN  CULTURE, GROUP A STREP   Imaging Review No results found.  EKG Interpretation   None       MDM   1. Viral syndrome        I personally performed the services described in this documentation, which was scribed in my presence. The recorded information has been reviewed and is accurate.   Arman Filter, NP 09/18/13 [phone]

## 2013-09-18 MED ORDER — IBUPROFEN 600 MG PO TABS: 600.0000 mg | ORAL_TABLET | Freq: Four times a day (QID) | ORAL | Status: DC | PRN

## 2013-09-18 NOTE — ED Provider Notes (Signed)
Medical screening examination/treatment/procedure(s) were performed by non-physician practitioner and as supervising physician I was immediately available for consultation/collaboration.  Shelden Raborn M Draysen Weygandt, MD 09/18/13 0301 

## 2013-09-20 LAB — CULTURE, GROUP A STREP: Culture: NO GROWTH

## 2013-09-22 ENCOUNTER — Emergency Department (HOSPITAL_COMMUNITY)
Admission: EM | Admit: 2013-09-22 | Discharge: 2013-09-22 | Disposition: A | Discharge status: Home / Self Care | Payer: Self-pay | Attending: Emergency Medicine | Admitting: Emergency Medicine

## 2013-09-22 ENCOUNTER — Encounter (HOSPITAL_COMMUNITY): Payer: Self-pay | Admitting: Emergency Medicine

## 2013-09-22 DIAGNOSIS — J069 Acute upper respiratory infection, unspecified: Secondary | ICD-10-CM | POA: Insufficient documentation

## 2013-09-22 DIAGNOSIS — R059 Cough, unspecified: Secondary | ICD-10-CM | POA: Insufficient documentation

## 2013-09-22 DIAGNOSIS — R0982 Postnasal drip: Secondary | ICD-10-CM | POA: Insufficient documentation

## 2013-09-22 DIAGNOSIS — Z79899 Other long term (current) drug therapy: Secondary | ICD-10-CM | POA: Insufficient documentation

## 2013-09-22 DIAGNOSIS — R05 Cough: Secondary | ICD-10-CM | POA: Insufficient documentation

## 2013-09-22 DIAGNOSIS — I10 Essential (primary) hypertension: Secondary | ICD-10-CM | POA: Insufficient documentation

## 2013-09-22 LAB — RAPID STREP SCREEN (MED CTR MEBANE ONLY): Streptococcus, Group A Screen (Direct): NEGATIVE

## 2013-09-22 MED ORDER — HYDROCOD POLST-CHLORPHEN POLST 10-8 MG/5ML PO LQCR: 5.0000 mL | Freq: Two times a day (BID) | ORAL | Status: DC | PRN

## 2013-09-22 MED ORDER — HYDROCODONE-HOMATROPINE 5-1.5 MG/5ML PO SYRP: 5.0000 mL | ORAL_SOLUTION | Freq: Four times a day (QID) | ORAL | Status: DC | PRN

## 2013-09-22 NOTE — Progress Notes (Signed)
[facility] provided pt with a list of primary care resources. Patient stated that she had filled out information on Affordable Care Act and was waiting to hear back.

## 2013-09-22 NOTE — ED Notes (Addendum)
Pt states that she has been having sore throat for the past few days not feeling well was seen on the 11 for the samething and tested for strep.

## 2013-09-22 NOTE — ED Provider Notes (Signed)
CSN: [id number]     Arrival date & time 09/22/13  1216 History   First MD Initiated Contact with Patient 09/22/13 1248     Chief Complaint  Patient presents with  . Sore Throat   (Consider location/radiation/quality/duration/timing/severity/associated sxs/prior Treatment) HPI Comments: Patient presents today with a chief complaint of sore throat.  Sore throat has been present for the past 1.5-2 weeks.  She was seen in the ED five days ago for the same and had a negative rapid strep done at that time.  She reports that she also has an associated dry cough and post nasal drip.  Sore throat is worsened with swallowing and also coughing.  She has taken Children's Indoor/Outdoor Allergy relief medication, Ibuprofen 600 mg, and Mucinex for her symptoms without relief.  She denies any difficulty swallowing.  Denies fever or chills.  Denies chest pain or SOB.  Denies sinus pain or pressure.  Denies nasal congestion.  Patient is a 52 y.o. female presenting with pharyngitis. The history is provided by the patient.  Sore Throat Associated symptoms include coughing and a sore throat. Pertinent negatives include no chills, congestion, fever, nausea or vomiting.    Past Medical History  Diagnosis Date  . Hypertension    History reviewed. No pertinent past surgical history. Family History  Problem Relation Age of Onset  . Hypertension Mother   . Sudden death Neg Hx   . Diabetes Neg Hx   . Heart attack Neg Hx   . Hyperlipidemia Neg Hx    History  Substance Use Topics  . Smoking status: Never Smoker   . Smokeless tobacco: Not on file  . Alcohol Use: No   OB History   Grav Para Term Preterm Abortions TAB SAB Ect Mult Living                 Review of Systems  Constitutional: Negative for fever and chills.  HENT: Positive for postnasal drip and sore throat. Negative for congestion, sinus pressure and trouble swallowing.   Respiratory: Positive for cough.   Gastrointestinal: Negative for  nausea and vomiting.  All other systems reviewed and are negative.    Allergies  Review of patient's allergies indicates no known allergies.  Home Medications   Current Outpatient Rx  Name  Route  Sig  Dispense  Refill  . acetaminophen (TYLENOL) 500 MG tablet   Oral   Take 500 mg by mouth every 6 (six) hours as needed for pain.         . cetirizine (ZYRTEC) 10 MG tablet   Oral   Take 10 mg by mouth daily.         . cyclobenzaprine (FLEXERIL) 10 MG tablet   Oral   Take 10 mg by mouth 3 (three) times daily as needed for muscle spasms.         Marland Kitchen guaiFENesin (MUCINEX) 600 MG 12 hr tablet   Oral   Take 1,200 mg by mouth 2 (two) times daily as needed for congestion.         Marland Kitchen ibuprofen (ADVIL,MOTRIN) 200 MG tablet   Oral   Take 600 mg by mouth every 8 (eight) hours as needed for pain.         Marland Kitchen ibuprofen (ADVIL,MOTRIN) 600 MG tablet   Oral   Take 1 tablet (600 mg total) by mouth every 6 (six) hours as needed for pain.   30 tablet   0   . lisinopril-hydrochlorothiazide (PRINZIDE,ZESTORETIC) 20-25 MG per tablet  Oral   Take 1 tablet by mouth every morning.         . nitroGLYCERIN (NITRODUR - DOSED IN MG/24 HR) 0.2 mg/hr      Apply 1/2 of a patch to affected Achilles tendon - change daily   30 patch   1     Please cut each patch in half for patient.   . verapamil (CALAN-SR) 240 MG CR tablet   Oral   Take 240 mg by mouth at bedtime.          BP 129/69  Pulse 63  Temp(Src) 98.4 F (36.9 C) (Oral)  Resp 15  SpO2 98% Physical Exam  Nursing note and vitals reviewed. Constitutional: She appears well-developed and well-nourished.  HENT:  Head: Normocephalic and atraumatic.  Right Ear: Tympanic membrane and ear canal normal.  Left Ear: Tympanic membrane and ear canal normal.  Nose: Nose normal. Right sinus exhibits no maxillary sinus tenderness and no frontal sinus tenderness. Left sinus exhibits no maxillary sinus tenderness and no frontal sinus  tenderness.  Mouth/Throat: Uvula is midline and mucous membranes are normal. No trismus in the jaw. No uvula swelling. Posterior oropharyngeal edema and posterior oropharyngeal erythema present. No oropharyngeal exudate.  Patient handling secretions well, no drooling Normal voice phonation  Neck: Normal range of motion. Neck supple.  Cardiovascular: Normal rate, regular rhythm and normal heart sounds.   Pulmonary/Chest: Effort normal and breath sounds normal.  Musculoskeletal: Normal range of motion.  Neurological: She is alert.  Skin: Skin is warm and dry.  Psychiatric: She has a normal mood and affect.    ED Course  Procedures (including critical care time) Labs Review Labs Reviewed  RAPID STREP SCREEN   Imaging Review No results found.  EKG Interpretation   None       MDM  No diagnosis found. Patient presenting with a sore throat.  Rapid strep negative.  No signs of a peritonsillar abscess at this time.  Patient with mild dry cough.  Patient is afebrile and not hypoxic.  Do not feel that chest xray is indicated at this time.  Feel that the patient is stable for discharge.  Return precautions given.    Pascal Lux [place], PA-C 09/22/13 1453

## 2013-09-24 LAB — CULTURE, GROUP A STREP

## 2013-09-24 NOTE — ED Provider Notes (Signed)
Medical screening examination/treatment/procedure(s) were performed by non-physician practitioner and as supervising physician I was immediately available for consultation/collaboration.   Suzi Roots, MD 09/24/13 [phone]

## 2014-04-21 ENCOUNTER — Other Ambulatory Visit: Payer: Self-pay | Admitting: Internal Medicine

## 2014-04-21 DIAGNOSIS — Z1231 Encounter for screening mammogram for malignant neoplasm of breast: Secondary | ICD-10-CM

## 2014-04-27 ENCOUNTER — Encounter: Payer: Self-pay | Admitting: Gastroenterology

## 2014-06-01 ENCOUNTER — Ambulatory Visit: Payer: Self-pay

## 2014-06-09 ENCOUNTER — Encounter (HOSPITAL_COMMUNITY): Payer: Self-pay | Admitting: Emergency Medicine

## 2014-06-09 ENCOUNTER — Emergency Department (HOSPITAL_COMMUNITY)
Admission: EM | Admit: 2014-06-09 | Discharge: 2014-06-09 | Disposition: A | Discharge status: Home / Self Care | Payer: Self-pay | Attending: Emergency Medicine | Admitting: Emergency Medicine

## 2014-06-09 ENCOUNTER — Emergency Department (HOSPITAL_COMMUNITY): Payer: Self-pay

## 2014-06-09 DIAGNOSIS — M549 Dorsalgia, unspecified: Secondary | ICD-10-CM | POA: Insufficient documentation

## 2014-06-09 DIAGNOSIS — Z79899 Other long term (current) drug therapy: Secondary | ICD-10-CM | POA: Insufficient documentation

## 2014-06-09 DIAGNOSIS — M25512 Pain in left shoulder: Secondary | ICD-10-CM

## 2014-06-09 DIAGNOSIS — IMO0001 Reserved for inherently not codable concepts without codable children: Secondary | ICD-10-CM | POA: Insufficient documentation

## 2014-06-09 DIAGNOSIS — R52 Pain, unspecified: Secondary | ICD-10-CM | POA: Insufficient documentation

## 2014-06-09 DIAGNOSIS — I10 Essential (primary) hypertension: Secondary | ICD-10-CM | POA: Insufficient documentation

## 2014-06-09 DIAGNOSIS — M25519 Pain in unspecified shoulder: Secondary | ICD-10-CM | POA: Insufficient documentation

## 2014-06-09 MED ORDER — OXYCODONE-ACETAMINOPHEN 5-325 MG PO TABS: 1.0000 | ORAL_TABLET | Freq: Four times a day (QID) | ORAL | Status: DC | PRN

## 2014-06-09 MED ORDER — NAPROXEN 500 MG PO TABS: 500.0000 mg | ORAL_TABLET | Freq: Two times a day (BID) | ORAL | Status: DC

## 2014-06-09 NOTE — ED Notes (Signed)
Ortho tech called 

## 2014-06-09 NOTE — ED Notes (Addendum)
Initial contact-pt reports left shoulder pain radiating up her arm and "a little to her neck." Pain with activity and described as "sharp pain like something isn't working." Patient denies lifting, pushing, or straining of any kind. A&Ox4. Ambulatory and moving all extremities. In NAD. No deformity noted. Moving all extremities equally. No cardiac history. 5 Lead EKG obtained. Awaiting PA.

## 2014-06-09 NOTE — ED Provider Notes (Signed)
CSN: [id number]     Arrival date & time 06/09/14  1432 History   First MD Initiated Contact with Patient 06/09/14 1616     Chief Complaint  Patient presents with  . Shoulder Pain   Patient is a 53 y.o. female presenting with shoulder pain. The history is provided by the patient. No language interpreter was used.  Shoulder Pain This is a new problem. The current episode started more than 2 days ago. The problem occurs constantly. The problem has not changed since onset.Pertinent negatives include no chest pain, no abdominal pain, no headaches and no shortness of breath. The symptoms are aggravated by twisting and bending. The symptoms are relieved by ASA. She has tried nothing for the symptoms. The treatment provided no relief.   This chart was scribed for non-physician practitioner working with Pamela MelterElliott L Wentz, MD, by Pamela Miles, ED Scribe. This patient was seen in room WTR6/WTR6 and the patient's care was started at 4:17 PM.  Pamela SkatesInger P Neita Miles is a 53 y.o. female who presents to the Emergency Department complaining of left shoulder pain x 3 days with intermittent upper back pain. Pt has pain with certain movement and when laying on left shoulder. Pt reports pain is alleviated by ibuprofen which also helps her sleep at night. Pt denies past and present known injuries. Pt denies numbness and tingling to bilateral arms. She denies elbow pain and neck pain. Pt denies CP, SOB, HA, rash, blurry vision, cough, abdominal pain, n/v/d.   Past Medical History  Diagnosis Date  . Hypertension    History reviewed. No pertinent past surgical history. Family History  Problem Relation Age of Onset  . Hypertension Mother   . Sudden death Neg Hx   . Diabetes Neg Hx   . Heart attack Neg Hx   . Hyperlipidemia Neg Hx    History  Substance Use Topics  . Smoking status: Never Smoker   . Smokeless tobacco: Not on file  . Alcohol Use: No   OB History   Grav Para Term Preterm Abortions TAB SAB Ect Mult Living                  Review of Systems  Eyes: Negative for photophobia and visual disturbance.  Respiratory: Negative for apnea, cough, chest tightness, shortness of breath and wheezing.   Cardiovascular: Negative for chest pain.  Gastrointestinal: Negative for nausea, vomiting, abdominal pain, diarrhea and constipation.  Musculoskeletal: Positive for back pain and myalgias. Negative for arthralgias and neck pain.  Skin: Negative for rash and wound.  Neurological: Negative for weakness, numbness and headaches.    Allergies  Review of patient's allergies indicates no known allergies.  Home Medications   Prior to Admission medications   Medication Sig Start Date End Date Taking? Authorizing Provider  ibuprofen (ADVIL,MOTRIN) 200 MG tablet Take 200 mg by mouth every 6 (six) hours as needed (pain).   Yes Historical Provider, MD  lisinopril-hydrochlorothiazide (PRINZIDE,ZESTORETIC) 20-25 MG per tablet Take 1 tablet by mouth every morning.   Yes Historical Provider, MD  potassium chloride SA (K-DUR,KLOR-CON) 20 MEQ tablet Take 20 mEq by mouth once.   Yes Historical Provider, MD  verapamil (CALAN-SR) 240 MG CR tablet Take 240 mg by mouth at bedtime.   Yes Historical Provider, MD   BP 98/60  Pulse 57  Temp(Src) 98.1 F (36.7 C) (Oral)  Resp 16  SpO2 100%  LMP 03/27/2012 Physical Exam  Nursing note and vitals reviewed. Constitutional: She is oriented to person, place,  and time. Vital signs are normal. She appears well-developed and well-nourished. No distress.  HENT:  Head: Normocephalic and atraumatic.  Mouth/Throat: Mucous membranes are normal.  Eyes: Conjunctivae and EOM are normal. Right eye exhibits no discharge. Left eye exhibits no discharge.  Neck: Normal range of motion. Neck supple. No spinous process tenderness and no muscular tenderness present. Normal range of motion present.  FROM intact, no spinous process TTP or paracervical muscle TTP. L sided trapezius muscle mildly TTP,  no spasm felt.  Cardiovascular: Normal rate.   Pulmonary/Chest: Effort normal.  Abdominal: Normal appearance. She exhibits no distension.  Musculoskeletal:       Left shoulder: She exhibits decreased range of motion and tenderness. She exhibits no swelling, no crepitus, no deformity, no spasm and normal strength.       Cervical back: Normal.       Thoracic back: Normal.       Lumbar back: Normal.  L shoulder moderately TTP at bicipital tendon insertion as well as in L sided trapezius muscle, with no spasms noted. No swelling or deformity. Pt unable to abduct arm without assistance, PROM possible but unable to maintain abduction. Pain with int rotation with resistance, no pain with ext rotation with resistance. Unable to perform apley scratch test. Strength 5/5 in b/l upper extremities.  Neurological: She is alert and oriented to person, place, and time. She has normal strength. No sensory deficit.  Strength 5/5 in b/l upper extremities. Sensation grossly intact in b/l upper extremities.   Skin: Skin is warm, dry and intact. No rash noted.  Psychiatric: She has a normal mood and affect.    ED Course  Procedures 4:24 PM-Discussed treatment plan which includes a sling and f/i with orthopedist.  Labs Review Labs Reviewed - No data to display  Imaging Review Dg Shoulder Left  06/09/2014   CLINICAL DATA:  Anterior and superior left shoulder pain. No known injuries. Limited range of motion.  EXAM: LEFT SHOULDER - 2+ VIEW  COMPARISON:  None.  FINDINGS: No evidence of acute, subacute or healed fractures. Glenohumeral joint intact. Subacromial space well preserved. Miles subacromial spur. Acromioclavicular joint intact. No intrinsic osseous abnormality.  IMPRESSION: No acute or subacute osseous abnormality. Miles subacromial spur may place the patient at risk for supraspinatus tendon impingement.   Electronically Signed   By: Pamela Miles M.D.   On: 06/09/2014 16:02     EKG  Interpretation None      MDM   Final diagnoses:  None   Pamela Miles is a 53 y.o. female with a PMHx of HTN presenting with 3 days of L shoulder pain. Exam findings consistent with rotator cuff injury, xray demonstrates subacromial spur which may cause supraspinatus tendon impingement. Pt also demonstrating bicipital tendinitis. 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 Will place in sling x 3-5 d with naproxen and percocet for pain and f/up with ortho in 1 week. Low clinical suspicion for ACS/PE give lack of symptoms and pt VSS. Note for work given. I explained the diagnosis and have given explicit precautions to return to the ER including for any other new or worsening symptoms. The patient understands and accepts the medical plan as it's been dictated and I have answered their questions. Discharge instructions concerning home care and prescriptions have been given. The patient is STABLE and is discharged to home in good condition.  I personally performed the services described in this documentation, which was scribed in my presence. The recorded  information has been reviewed and is accurate.    BP 98/60  Pulse 57  Temp(Src) 98.1 F (36.7 C) (Oral)  Resp 16  SpO2 100%  LMP 03/27/2012    Donnita FallsMercedes Strupp Camprubi-Soms, PA-C 06/09/14 1658

## 2014-06-09 NOTE — ED Notes (Signed)
Pt reports left shoulder pain since Tuesday, denies injury, sts it hurts to try to lift her arm up.

## 2014-06-09 NOTE — Discharge Instructions (Signed)
Use ice or heat to your shoulder for comfort. Use the sling for the next 3-5 days, then start doing gentle range of motion exercises. Use Naproxen and Percocet for pain, as needed, as directed. See the orthopedic doctor in 1 week to be evaluated for a possible rotator cuff injury. If symptoms worsen or change, return to the emergency department.   Shoulder Pain The shoulder is the joint that connects your arms to your body. The bones that form the shoulder joint include the upper arm bone (humerus), the shoulder blade (scapula), and the collarbone (clavicle). The top of the humerus is shaped like a ball and fits into a rather flat socket on the scapula (glenoid cavity). A combination of muscles and strong, fibrous tissues that connect muscles to bones (tendons) support your shoulder joint and hold the ball in the socket. Small, fluid-filled sacs (bursae) are located in different areas of the joint. They act as cushions between the bones and the overlying soft tissues and help reduce friction between the gliding tendons and the bone as you move your arm. Your shoulder joint allows a wide range of motion in your arm. This range of motion allows you to do things like scratch your back or throw a ball. However, this range of motion also makes your shoulder more prone to pain from overuse and injury. Causes of shoulder pain can originate from both injury and overuse and usually can be grouped in the following four categories:  Redness, swelling, and pain (inflammation) of the tendon (tendinitis) or the bursae (bursitis).  Instability, such as a dislocation of the joint.  Inflammation of the joint (arthritis).  Broken bone (fracture). HOME CARE INSTRUCTIONS   Apply ice to the sore area.  Put ice in a plastic bag.  Place a towel between your skin and the bag.  Leave the ice on for 15-20 minutes, 3-4 times per day for the first 2 days, or as directed by your health care provider.  Stop using cold  packs if they do not help with the pain.  If you have a shoulder sling or immobilizer, wear it as long as your caregiver instructs. Only remove it to shower or bathe. Move your arm as little as possible, but keep your hand moving to prevent swelling.  Squeeze a soft ball or foam pad as much as possible to help prevent swelling.  Only take over-the-counter or prescription medicines for pain, discomfort, or fever as directed by your caregiver. SEEK MEDICAL CARE IF:   Your shoulder pain increases, or new pain develops in your arm, hand, or fingers.  Your hand or fingers become cold and numb.  Your pain is not relieved with medicines. SEEK IMMEDIATE MEDICAL CARE IF:   Your arm, hand, or fingers are numb or tingling.  Your arm, hand, or fingers are significantly swollen or turn white or blue. MAKE SURE YOU:   Understand these instructions.  Will watch your condition.  Will get help right away if you are not doing well or get worse. Document Released: 09/03/2005 Document Revised: 11/29/2013 Document Reviewed: 11/08/2011 [facility] Patient Information 2015 [place], [place]. This information is not intended to replace advice given to you by your health care provider. Make sure you discuss any questions you have with your health care provider.  Shoulder Range of Motion Exercises The shoulder is the most flexible joint in the human body. Because of this it is also the most unstable joint in the body. All ages can develop shoulder problems. Early  treatment of problems is necessary for a good outcome. People react to shoulder pain by decreasing the movement of the joint. After a brief period of time, the shoulder can become "frozen". This is an almost complete loss of the ability to move the damaged shoulder. Following injuries your caregivers can give you instructions on exercises to keep your range of motion (ability to move your shoulder freely), or regain it if it has been lost.   EXERCISES EXERCISES TO MAINTAIN THE MOBILITY OF YOUR SHOULDER: Codman's Exercise or Pendulum Exercise  This exercise may be performed in a prone (face-down) lying position or standing while leaning on a chair with the opposite arm. Its purpose is to relax the muscles in your shoulder and slowly but surely increase the range of motion and to relieve pain.  Lie on your stomach close to the side edge of the bed. Let your weak arm hang over the edge of the bed. Relax your shoulder, arm and hand. Let your shoulder blade relax and drop down.  Slowly and gently swing your arm forward and back. Do not use your neck muscles; relax them. It might be easier to have someone else gently start swinging your arm.  As pain decreases, increase your swing. To start, arm swing should begin at 15 degree angles. In time and as pain lessens, move to 30-45 degree angles. Start with swinging for about 15 seconds, and work towards swinging for 3 to 5 minutes.  This exercise may also be performed in a standing/bent over position.  Stand and hold onto a sturdy chair with your good arm. Bend forward at the waist and bend your knees slightly to help protect your back. Relax your weak arm, let it hang limp. Relax your shoulder blade and let it drop.  Keep your shoulder relaxed and use body motion to swing your arm in small circles.  Stand up tall and relax.  Repeat motion and change direction of circles.  Start with swinging for about 30 seconds, and work towards swinging for 3 to 5 minutes. STRETCHING EXERCISES:  Lift your arm out in front of you with the elbow bent at 90 degrees. Using your other arm gently pull the elbow forward and across your body.  Bend one arm behind you with the palm facing outward. Using the other arm, hold a towel or rope and reach this arm up above your head, then bend it at the elbow to move your wrist to behind your neck. Grab the free end of the towel with the hand behind your back.  Gently pull the towel up with the hand behind your neck, gradually increasing the pull on the hand behind the small of your back. Then, gradually pull down with the hand behind the small of your back. This will pull the hand and arm behind your neck further. Both shoulders will have an increased range of motion with repetition of this exercise. STRENGTHENING EXERCISES:  Standing with your arm at your side and straight out from your shoulder with the elbow bent at 90 degrees, hold onto a small weight and slowly raise your hand so it points straight up in the air. Repeat this five times to begin with, and gradually increase to ten times. Do this four times per day. As you grow stronger you can gradually increase the weight.  Repeat the above exercise, only this time using an elastic band. Start with your hand up in the air and pull down until your hand is by  your side. As you grow stronger, gradually increase the amount you pull by increasing the number or size of the elastic bands. Use the same amount of repetitions.  Standing with your hand at your side and holding onto a weight, gradually lift the hand in front of you until it is over your head. Do the same also with the hand remaining at your side and lift the hand away from your body until it is again over your head. Repeat this five times to begin with, and gradually increase to ten times. Do this four times per day. As you grow stronger you can gradually increase the weight. Document Released: 08/23/2003 Document Revised: 11/29/2013 Document Reviewed: 11/24/2005 [facility] Patient Information 2015 [place], [place]. This information is not intended to replace advice given to you by your health care provider. Make sure you discuss any questions you have with your health care provider.  Heat Therapy Heat therapy can help make painful, stiff muscles and joints feel better. Do not use heat on new injuries. Wait at least 48 hours after an injury to use heat.  Do not use heat when you have aches or pains right after an activity. If you still have pain 3 hours after stopping the activity, then you may use heat. HOME CARE Wet heat pack  Soak a clean towel in warm water. Squeeze out the extra water.  Put the warm, wet towel in a plastic bag.  Place a thin, dry towel between your skin and the bag.  Put the heat pack on the area for 5 minutes, and check your skin. Your skin may be pink, but it should not be red.  Leave the heat pack on the area for 15 to 30 minutes.  Repeat this every 2 to 4 hours while awake. Do not use heat while you are sleeping. Warm water bath  Fill a tub with warm water.  Place the affected body part in the tub.  Soak the area for 20 to 40 minutes.  Repeat as needed. Hot water bottle  Fill the water bottle half full with hot water.  Press out the extra air. Close the cap tightly.  Place a dry towel between your skin and the bottle.  Put the bottle on the area for 5 minutes, and check your skin. Your skin may be pink, but it should not be red.  Leave the bottle on the area for 15 to 30 minutes.  Repeat this every 2 to 4 hours while awake. Electric heating pad  Place a dry towel between your skin and the heating pad.  Set the heating pad on low heat.  Put the heating pad on the area for 10 minutes, and check your skin. Your skin may be pink, but it should not be red.  Leave the heating pad on the area for 20 to 40 minutes.  Repeat this every 2 to 4 hours while awake.  Do not lie on the heating pad.  Do not fall asleep while using the heating pad.  Do not use the heating pad near water. GET HELP RIGHT AWAY IF:  You get blisters or red skin.  Your skin is puffy (swollen), or you lose feeling (numbness) in the affected area.  You have any new problems.  Your problems are getting worse.  You have any questions or concerns. If you have any problems, stop using heat therapy until you see your  doctor. MAKE SURE YOU:  Understand these instructions.  Will watch your condition.  Will get help right away if you are not doing well or get worse. Document Released: 02/16/2012 Document Reviewed: 02/16/2012 [facility] Patient Information 2015 [place]. This information is not intended to replace advice given to you by your health care provider. Make sure you discuss any questions you have with your health care provider.

## 2014-06-16 ENCOUNTER — Encounter: Payer: Self-pay | Admitting: Gastroenterology

## 2014-06-18 NOTE — ED Provider Notes (Signed)
Medical screening examination/treatment/procedure(s) were performed by non-physician practitioner and as supervising physician I was immediately available for consultation/collaboration.   EKG Interpretation None       Flint MelterElliott L Larnce Schnackenberg, MD 06/18/14 1428

## 2016-05-07 ENCOUNTER — Other Ambulatory Visit: Payer: Self-pay | Admitting: Family Medicine

## 2016-05-07 ENCOUNTER — Other Ambulatory Visit (HOSPITAL_COMMUNITY)
Admission: RE | Admit: 2016-05-07 | Discharge: 2016-05-07 | Disposition: A | Discharge status: Home / Self Care | Payer: BLUE CROSS/BLUE SHIELD | Source: Ambulatory Visit | Attending: Family Medicine | Admitting: Family Medicine

## 2016-05-07 DIAGNOSIS — Z113 Encounter for screening for infections with a predominantly sexual mode of transmission: Secondary | ICD-10-CM | POA: Diagnosis present

## 2016-05-07 DIAGNOSIS — Z01419 Encounter for gynecological examination (general) (routine) without abnormal findings: Secondary | ICD-10-CM | POA: Diagnosis present

## 2016-05-09 LAB — CYTOLOGY - PAP

## 2016-06-25 DIAGNOSIS — M545 Low back pain: Secondary | ICD-10-CM | POA: Diagnosis not present

## 2016-06-25 DIAGNOSIS — M4056 Lordosis, unspecified, lumbar region: Secondary | ICD-10-CM | POA: Diagnosis not present

## 2016-06-25 DIAGNOSIS — M5136 Other intervertebral disc degeneration, lumbar region: Secondary | ICD-10-CM | POA: Diagnosis not present

## 2016-06-25 DIAGNOSIS — M4696 Unspecified inflammatory spondylopathy, lumbar region: Secondary | ICD-10-CM | POA: Diagnosis not present

## 2016-09-26 DIAGNOSIS — Z79899 Other long term (current) drug therapy: Secondary | ICD-10-CM | POA: Diagnosis not present

## 2016-09-26 DIAGNOSIS — M791 Myalgia: Secondary | ICD-10-CM | POA: Diagnosis not present

## 2016-09-27 ENCOUNTER — Emergency Department (HOSPITAL_COMMUNITY)
Admission: EM | Admit: 2016-09-27 | Discharge: 2016-09-27 | Disposition: A | Discharge status: Home / Self Care | Payer: BLUE CROSS/BLUE SHIELD | Attending: Emergency Medicine | Admitting: Emergency Medicine

## 2016-09-27 ENCOUNTER — Encounter (HOSPITAL_COMMUNITY): Payer: Self-pay | Admitting: *Deleted

## 2016-09-27 DIAGNOSIS — I10 Essential (primary) hypertension: Secondary | ICD-10-CM | POA: Insufficient documentation

## 2016-09-27 DIAGNOSIS — M791 Myalgia: Secondary | ICD-10-CM | POA: Diagnosis not present

## 2016-09-27 DIAGNOSIS — Z79899 Other long term (current) drug therapy: Secondary | ICD-10-CM | POA: Diagnosis not present

## 2016-09-27 DIAGNOSIS — R252 Cramp and spasm: Secondary | ICD-10-CM | POA: Diagnosis not present

## 2016-09-27 DIAGNOSIS — R001 Bradycardia, unspecified: Secondary | ICD-10-CM | POA: Diagnosis not present

## 2016-09-27 LAB — BASIC METABOLIC PANEL
ANION GAP: 5 (ref 5–15)
BUN: 20 mg/dL (ref 6–20)
CALCIUM: 9.4 mg/dL (ref 8.9–10.3)
CHLORIDE: 107 mmol/L (ref 101–111)
CO2: 26 mmol/L (ref 22–32)
Creatinine, Ser: 0.71 mg/dL (ref 0.44–1.00)
Glucose, Bld: 91 mg/dL (ref 65–99)
POTASSIUM: 4.8 mmol/L (ref 3.5–5.1)
SODIUM: 138 mmol/L (ref 135–145)

## 2016-09-27 LAB — URINALYSIS, ROUTINE W REFLEX MICROSCOPIC
BILIRUBIN URINE: NEGATIVE
GLUCOSE, UA: NEGATIVE mg/dL
HGB URINE DIPSTICK: NEGATIVE
Ketones, ur: NEGATIVE mg/dL
Leukocytes, UA: NEGATIVE
Nitrite: NEGATIVE
Protein, ur: NEGATIVE mg/dL
Specific Gravity, Urine: 1.019 (ref 1.005–1.030)
pH: 6 (ref 5.0–8.0)

## 2016-09-27 LAB — CBC WITH DIFFERENTIAL/PLATELET
BASOS ABS: 0 10*3/uL (ref 0.0–0.1)
Basophils Relative: 0 %
EOS PCT: 5 %
Eosinophils Absolute: 0.2 10*3/uL (ref 0.0–0.7)
HEMATOCRIT: 38.7 % (ref 36.0–46.0)
Hemoglobin: 12.2 g/dL (ref 12.0–15.0)
Lymphocytes Relative: 38 %
Lymphs Abs: 1.4 10*3/uL (ref 0.7–4.0)
MCH: 27.4 pg (ref 26.0–34.0)
MCHC: 31.5 g/dL (ref 30.0–36.0)
MCV: 87 fL (ref 78.0–100.0)
MONO ABS: 0.5 10*3/uL (ref 0.1–1.0)
MONOS PCT: 12 %
Neutro Abs: 1.6 10*3/uL — ABNORMAL LOW (ref 1.7–7.7)
Neutrophils Relative %: 45 %
PLATELETS: 224 10*3/uL (ref 150–400)
RBC: 4.45 MIL/uL (ref 3.87–5.11)
RDW: 14.6 % (ref 11.5–15.5)
WBC: 3.7 10*3/uL — ABNORMAL LOW (ref 4.0–10.5)

## 2016-09-27 LAB — TROPONIN I: Troponin I: <0.03 ng/mL (ref <0.03)

## 2016-09-27 LAB — CK: Total CK: 275 U/L — ABNORMAL HIGH (ref 38–234)

## 2016-09-27 NOTE — ED Triage Notes (Signed)
Pt. Stated, My doctor called and said my CPK level was 347 and needed to come here.. Labs were done yesterday, she called this morning. Pt. Has no symptoms

## 2016-09-27 NOTE — ED Provider Notes (Addendum)
[facility]EMERGENCY DEPT Provider Note   CSN: [id number] Arrival date & time: 09/27/16  0919     History   Chief Complaint Chief Complaint  Patient presents with  . Labs Only    abnormal labs.    HPI Pamela Miles is a 55 y.o. female.  55yo F w/ HTN who p/w abnormal labs. The patient saw her PCP yesterday for 3-4 years of muscle cramps in thighs and pain radiating down her knees. She also reports intermittent L hand swelling and numbness associated with sharp pains shooting to her elbow. As part of her evaluation of the muscle cramps, they obtained labs and she was called today to go to the ER due to CPK level 347. She denies any recent strenuous physical activity, no color change of urine, no change in PO intake.    The history is provided by the patient.    Past Medical History:  Diagnosis Date  . Hypertension     Patient Active Problem List   Diagnosis Date Noted  . Left ankle pain 08/23/2012  . Chest pain 05/28/2012  . Lower back injury 04/29/2012  . Neck injury 04/29/2012  . Strain of left Achilles tendon 04/29/2012    History reviewed. No pertinent surgical history.  OB History    No data available       Home Medications    Prior to Admission medications   Medication Sig Start Date End Date Taking? Authorizing Provider  ibuprofen (ADVIL,MOTRIN) 200 MG tablet Take 200 mg by mouth every 6 (six) hours as needed (pain).   Yes Historical Provider, MD  lisinopril-hydrochlorothiazide (PRINZIDE,ZESTORETIC) 20-12.5 MG tablet Take 1 tablet by mouth daily.   Yes Historical Provider, MD  Multiple Vitamins-Minerals (MULTIVITAMIN WITH MINERALS) tablet Take 1 tablet by mouth daily.   Yes Historical Provider, MD  Naproxen-Esomeprazole (VIMOVO) 500-20 MG TBEC Take 1 tablet by mouth daily.   Yes Historical Provider, MD  potassium chloride (K-DUR) 10 MEQ tablet Take 10 mEq by mouth daily.   Yes Historical Provider, MD  verapamil (CALAN-SR) 240 MG CR tablet Take 240 mg by  mouth at bedtime.   Yes Historical Provider, MD  lisinopril-hydrochlorothiazide (PRINZIDE,ZESTORETIC) 20-25 MG per tablet Take 1 tablet by mouth every morning.    Historical Provider, MD  naproxen (NAPROSYN) 500 MG tablet Take 1 tablet (500 mg total) by mouth 2 (two) times daily. Patient not taking: Reported on 09/27/2016 06/09/14   Mercedes Camprubi-Soms, PA-C  oxyCODONE-acetaminophen (PERCOCET) 5-325 MG per tablet Take 1-2 tablets by mouth every 6 (six) hours as needed. Patient not taking: Reported on 09/27/2016 06/09/14   Mercedes Camprubi-Soms, PA-C  potassium chloride SA (K-DUR,KLOR-CON) 20 MEQ tablet Take 20 mEq by mouth once.    Historical Provider, MD    Family History Family History  Problem Relation Age of Onset  . Hypertension Mother   . Sudden death Neg Hx   . Diabetes Neg Hx   . Heart attack Neg Hx   . Hyperlipidemia Neg Hx     Social History Social History  Substance Use Topics  . Smoking status: Never Smoker  . Smokeless tobacco: Never Used  . Alcohol use Yes     Allergies   Review of patient's allergies indicates no known allergies.   Review of Systems Review of Systems 10 Systems reviewed and are negative for acute change except as noted in the HPI.   Physical Exam Updated Vital Signs BP 118/70   Pulse (!) 51   Temp 98.3 F (36.8 C) (  Oral)   Resp 17   LMP 03/27/2012   SpO2 100%   Physical Exam  Constitutional: She is oriented to person, place, and time. She appears well-developed and well-nourished. No distress.  HENT:  Head: Normocephalic and atraumatic.  Moist mucous membranes  Eyes: Conjunctivae are normal. Pupils are equal, round, and reactive to light.  Neck: Neck supple.  Cardiovascular: Normal rate, regular rhythm and normal heart sounds.   No murmur heard. Pulmonary/Chest: Effort normal and breath sounds normal.  Abdominal: Soft. Bowel sounds are normal. She exhibits no distension. There is no tenderness.  Musculoskeletal: She exhibits no  edema or tenderness.  Neurological: She is alert and oriented to person, place, and time.  Fluent speech  Skin: Skin is warm and dry.  Psychiatric: Judgment normal.  Mildly anxious  Nursing note and vitals reviewed.    ED Treatments / Results  Labs (all labs ordered are listed, but only abnormal results are displayed) Labs Reviewed  CBC WITH DIFFERENTIAL/PLATELET - Abnormal; Notable for the following:       Result Value   WBC 3.7 (*)    Neutro Abs 1.6 (*)    All other components within normal limits  CK - Abnormal; Notable for the following:    Total CK 275 (*)    All other components within normal limits  URINALYSIS, ROUTINE W REFLEX MICROSCOPIC (NOT AT [facility]) - Abnormal; Notable for the following:    Color, Urine AMBER (*)    All other components within normal limits  BASIC METABOLIC PANEL  TROPONIN I    EKG  EKG Interpretation  Date/Time:  Saturday September 27 2016 09:48:55 EDT Ventricular Rate:  49 PR Interval:    QRS Duration: 88 QT Interval:  436 QTC Calculation: 394 R Axis:   63 Text Interpretation:  Sinus bradycardia Prominent P waves, nondiagnostic ST elevation, consider lateral injury No significant change since last tracing Confirmed by LITTLE MD, RACHEL ([id number]) on 09/27/2016 9:52:09 AM       Radiology No results found.  Procedures Procedures (including critical care time)  Medications Ordered in ED Medications - No data to display   Initial Impression / Assessment and Plan / ED Course  I have reviewed the triage vital signs and the nursing notes.  Pertinent labs that were available during my care of the patient were reviewed by me and considered in my medical decision making (see chart for details).  Clinical Course    Patient sent by PCP for further evaluation of elevated CPK of 300, tainted because of the patient's report of muscle cramps in her legs for 3-4 years. She was well-appearing with normal vital signs at presentation. EKG unchanged  from previous. Her lab work here shows normal BMP, negative troponin, CK 275. The etiology of her slight elevation of CK is unclear but she is tolerating PO and I have instructed on good hydration at home. UA unremarkable here. No other signs/sx of rhabdo. Instructed to follow closely w/ PCP for further workup as outpatient. Return precautions reviewed and patient discharged in satisfactory condition.  Final Clinical Impressions(s) / ED Diagnoses   Final diagnoses:  Muscle cramps    New Prescriptions Discharge Medication List as of 09/27/2016  1:16 PM       Laurence Spatesachel Morgan Little, MD 09/27/16 1555    Laurence Spatesachel Morgan Little, MD 10/02/16 [phone]

## 2016-10-02 DIAGNOSIS — M791 Myalgia: Secondary | ICD-10-CM | POA: Diagnosis not present

## 2016-10-09 DIAGNOSIS — M47816 Spondylosis without myelopathy or radiculopathy, lumbar region: Secondary | ICD-10-CM | POA: Diagnosis not present

## 2016-10-09 DIAGNOSIS — G5602 Carpal tunnel syndrome, left upper limb: Secondary | ICD-10-CM | POA: Diagnosis not present

## 2016-10-09 DIAGNOSIS — R748 Abnormal levels of other serum enzymes: Secondary | ICD-10-CM | POA: Diagnosis not present

## 2016-10-09 DIAGNOSIS — M62838 Other muscle spasm: Secondary | ICD-10-CM | POA: Diagnosis not present

## 2016-12-25 ENCOUNTER — Encounter (HOSPITAL_COMMUNITY): Payer: Self-pay | Admitting: Emergency Medicine

## 2016-12-25 ENCOUNTER — Emergency Department (HOSPITAL_COMMUNITY)
Admission: EM | Admit: 2016-12-25 | Discharge: 2016-12-25 | Disposition: A | Discharge status: Home / Self Care | Payer: BLUE CROSS/BLUE SHIELD | Attending: Emergency Medicine | Admitting: Emergency Medicine

## 2016-12-25 DIAGNOSIS — J111 Influenza due to unidentified influenza virus with other respiratory manifestations: Secondary | ICD-10-CM | POA: Diagnosis not present

## 2016-12-25 DIAGNOSIS — J029 Acute pharyngitis, unspecified: Secondary | ICD-10-CM | POA: Diagnosis not present

## 2016-12-25 DIAGNOSIS — R0981 Nasal congestion: Secondary | ICD-10-CM | POA: Insufficient documentation

## 2016-12-25 DIAGNOSIS — Z79899 Other long term (current) drug therapy: Secondary | ICD-10-CM | POA: Diagnosis not present

## 2016-12-25 DIAGNOSIS — I10 Essential (primary) hypertension: Secondary | ICD-10-CM | POA: Diagnosis not present

## 2016-12-25 DIAGNOSIS — R69 Illness, unspecified: Secondary | ICD-10-CM

## 2016-12-25 MED ORDER — NAPROXEN 500 MG PO TABS: 500.0000 mg | ORAL_TABLET | Freq: Two times a day (BID) | ORAL | 0 refills | Status: DC

## 2016-12-25 MED ORDER — DIPHENHYDRAMINE HCL 25 MG PO TABS: 25.0000 mg | ORAL_TABLET | Freq: Four times a day (QID) | ORAL | 0 refills | Status: DC

## 2016-12-25 MED ORDER — FLUTICASONE PROPIONATE 50 MCG/ACT NA SUSP: 2.0000 | Freq: Every day | NASAL | 0 refills | Status: DC

## 2016-12-25 NOTE — ED Triage Notes (Signed)
Pt sts body aches and sore throat x 4 days

## 2016-12-25 NOTE — Discharge Instructions (Signed)
Read the information below.  Use the prescribed medication as directed.  Please discuss all new medications with your pharmacist.  You may return to the Emergency Department at any time for worsening condition or any new symptoms that concern you.  If you develop high fevers that do not resolve with tylenol or ibuprofen, you have difficulty swallowing or breathing, or you are unable to tolerate fluids by mouth, return to the ER for a recheck.    °

## 2016-12-25 NOTE — ED Provider Notes (Signed)
[facility] Provider Note   CSN: [id number] Arrival date & time: 12/25/16  1116     History   Chief Complaint Chief Complaint  Patient presents with  . Generalized Body Aches    HPI Pamela Miles is a 56 y.o. female.  HPI   Pt with hx HTN p/w 4 days nasal congestion, sneezing, sore throat, ear pressure, myalgias, chills, headache.  Has been taking coricidin, alka selzer plus, and tylenol without improvement.  She did get a flu shot this season.  Has many sick contacts, works in a nursing home.  Denies cough, SOB.    Past Medical History:  Diagnosis Date  . Hypertension     Patient Active Problem List   Diagnosis Date Noted  . Left ankle pain 08/23/2012  . Chest pain 05/28/2012  . Lower back injury 04/29/2012  . Neck injury 04/29/2012  . Strain of left Achilles tendon 04/29/2012    History reviewed. No pertinent surgical history.  OB History    No data available       Home Medications    Prior to Admission medications   Medication Sig Start Date End Date Taking? Authorizing Provider  diphenhydrAMINE (BENADRYL) 25 MG tablet Take 1 tablet (25 mg total) by mouth every 6 (six) hours. 12/25/16   Trixie DredgeEmily Angila Wombles, PA-C  fluticasone (FLONASE) 50 MCG/ACT nasal spray Place 2 sprays into both nostrils daily. 12/25/16   Trixie DredgeEmily Saffron Busey, PA-C  lisinopril-hydrochlorothiazide (PRINZIDE,ZESTORETIC) 20-12.5 MG tablet Take 1 tablet by mouth daily.    Historical Provider, MD  lisinopril-hydrochlorothiazide (PRINZIDE,ZESTORETIC) 20-25 MG per tablet Take 1 tablet by mouth every morning.    Historical Provider, MD  Multiple Vitamins-Minerals (MULTIVITAMIN WITH MINERALS) tablet Take 1 tablet by mouth daily.    Historical Provider, MD  naproxen (NAPROSYN) 500 MG tablet Take 1 tablet (500 mg total) by mouth 2 (two) times daily. 12/25/16   Trixie DredgeEmily Tyrell Seifer, PA-C  Naproxen-Esomeprazole (VIMOVO) 500-20 MG TBEC Take 1 tablet by mouth daily.    Historical Provider, MD  oxyCODONE-acetaminophen  (PERCOCET) 5-325 MG per tablet Take 1-2 tablets by mouth every 6 (six) hours as needed. Patient not taking: Reported on 09/27/2016 06/09/14   Donnita FallsMercedes [address]pp [address], PA-C  potassium chloride (K-DUR) 10 MEQ tablet Take 10 mEq by mouth daily.    Historical Provider, MD  potassium chloride SA (K-DUR,KLOR-CON) 20 MEQ tablet Take 20 mEq by mouth once.    Historical Provider, MD  verapamil (CALAN-SR) 240 MG CR tablet Take 240 mg by mouth at bedtime.    Historical Provider, MD    Family History Family History  Problem Relation Age of Onset  . Hypertension Mother   . Sudden death Neg Hx   . Diabetes Neg Hx   . Heart attack Neg Hx   . Hyperlipidemia Neg Hx     Social History Social History  Substance Use Topics  . Smoking status: Never Smoker  . Smokeless tobacco: Never Used  . Alcohol use Yes     Allergies   Patient has no known allergies.   Review of Systems Review of Systems  All other systems reviewed and are negative.    Physical Exam Updated Vital Signs BP 111/60 (BP Location: Right Arm)   Pulse (!) 59   Temp 98.2 F (36.8 C) (Oral)   Resp 16   Ht 5\' 6"  (1.676 m)   Wt 101.5 kg   LMP 03/27/2012   SpO2 100%   BMI 36.11 kg/m   Physical Exam  Constitutional: She appears  well-developed and well-nourished. No distress.  HENT:  Head: Normocephalic and atraumatic.  Right Ear: Ear canal normal. A middle ear effusion is present.  Left Ear: Ear canal normal. A middle ear effusion is present.  Nose: Mucosal edema and rhinorrhea present.  Mouth/Throat: Uvula is midline and oropharynx is clear and moist. No oropharyngeal exudate, posterior oropharyngeal edema or posterior oropharyngeal erythema.  Clear effusion bilateral TMs. No bulging, no purulence, no rupture.  No erythema.    Eyes: Conjunctivae are normal.  Neck: Neck supple.  Cardiovascular: Normal rate and regular rhythm.   Pulmonary/Chest: Effort normal and breath sounds normal. No respiratory distress. She has no  wheezes. She has no rales.  Neurological: She is alert.  Skin: She is not diaphoretic.  Nursing note and vitals reviewed.    ED Treatments / Results  Labs (all labs ordered are listed, but only abnormal results are displayed) Labs Reviewed - No data to display  EKG  EKG Interpretation None       Radiology No results found.  Procedures Procedures (including critical care time)  Medications Ordered in ED Medications - No data to display   Initial Impression / Assessment and Plan / ED Course  I have reviewed the triage vital signs and the nursing notes.  Pertinent labs & imaging results that were available during my care of the patient were reviewed by me and considered in my medical decision making (see chart for details).    Afebrile, nontoxic patient with constellation of symptoms suggestive of viral syndrome, likely influenza.  No concerning findings on exam.  Discharged home with supportive care, PCP follow up. Discussed result, findings, treatment, and follow up  with patient.  Pt given return precautions.  Pt verbalizes understanding and agrees with plan.       Final Clinical Impressions(s) / ED Diagnoses   Final diagnoses:  Influenza-like illness    New Prescriptions Discharge Medication List as of 12/25/2016 12:25 PM    START taking these medications   Details  diphenhydrAMINE (BENADRYL) 25 MG tablet Take 1 tablet (25 mg total) by mouth every 6 (six) hours., Starting Thu 12/25/2016, Print    fluticasone (FLONASE) 50 MCG/ACT nasal spray Place 2 sprays into both nostrils daily., Starting Thu 12/25/2016, Print         [place], PA-C 12/25/16 1251    Gwyneth Sprout, MD 12/25/16 2039

## 2016-12-25 NOTE — ED Notes (Signed)
Pt c/o having chills, sneezing, and feeling like her ears are clogged. Pt denies fever.

## 2016-12-25 NOTE — ED Notes (Signed)
Pt ambulatory aat DC. NAD. Pt verbalizes understanding of DC teaching and medications.

## 2017-01-16 DIAGNOSIS — M791 Myalgia: Secondary | ICD-10-CM | POA: Diagnosis not present

## 2017-04-20 DIAGNOSIS — R05 Cough: Secondary | ICD-10-CM | POA: Diagnosis not present

## 2017-04-20 DIAGNOSIS — Z77120 Contact with and (suspected) exposure to mold (toxic): Secondary | ICD-10-CM | POA: Diagnosis not present

## 2017-05-14 DIAGNOSIS — I10 Essential (primary) hypertension: Secondary | ICD-10-CM | POA: Diagnosis not present

## 2017-05-14 DIAGNOSIS — Z79899 Other long term (current) drug therapy: Secondary | ICD-10-CM | POA: Diagnosis not present

## 2017-05-14 DIAGNOSIS — Z13220 Encounter for screening for lipoid disorders: Secondary | ICD-10-CM | POA: Diagnosis not present

## 2017-05-14 DIAGNOSIS — M5136 Other intervertebral disc degeneration, lumbar region: Secondary | ICD-10-CM | POA: Diagnosis not present

## 2017-05-14 DIAGNOSIS — R079 Chest pain, unspecified: Secondary | ICD-10-CM | POA: Diagnosis not present

## 2017-05-14 DIAGNOSIS — Z0000 Encounter for general adult medical examination without abnormal findings: Secondary | ICD-10-CM | POA: Diagnosis not present

## 2017-05-20 ENCOUNTER — Ambulatory Visit: Payer: BLUE CROSS/BLUE SHIELD | Admitting: Physical Therapy

## 2018-01-14 DIAGNOSIS — R5383 Other fatigue: Secondary | ICD-10-CM | POA: Diagnosis not present

## 2018-01-14 DIAGNOSIS — I10 Essential (primary) hypertension: Secondary | ICD-10-CM | POA: Diagnosis not present

## 2018-01-14 DIAGNOSIS — R61 Generalized hyperhidrosis: Secondary | ICD-10-CM | POA: Diagnosis not present

## 2018-01-14 DIAGNOSIS — Z79899 Other long term (current) drug therapy: Secondary | ICD-10-CM | POA: Diagnosis not present

## 2018-04-06 DIAGNOSIS — H0014 Chalazion left upper eyelid: Secondary | ICD-10-CM | POA: Diagnosis not present

## 2018-04-06 DIAGNOSIS — H16042 Marginal corneal ulcer, left eye: Secondary | ICD-10-CM | POA: Diagnosis not present

## 2018-05-12 DIAGNOSIS — R0683 Snoring: Secondary | ICD-10-CM | POA: Diagnosis not present

## 2018-05-12 DIAGNOSIS — G4719 Other hypersomnia: Secondary | ICD-10-CM | POA: Diagnosis not present

## 2018-05-12 DIAGNOSIS — G478 Other sleep disorders: Secondary | ICD-10-CM | POA: Diagnosis not present

## 2018-06-25 DIAGNOSIS — J329 Chronic sinusitis, unspecified: Secondary | ICD-10-CM | POA: Diagnosis not present

## 2018-07-10 DIAGNOSIS — J209 Acute bronchitis, unspecified: Secondary | ICD-10-CM | POA: Diagnosis not present

## 2018-11-29 DIAGNOSIS — M940 Chondrocostal junction syndrome [Tietze]: Secondary | ICD-10-CM | POA: Diagnosis not present

## 2018-11-29 DIAGNOSIS — M545 Low back pain: Secondary | ICD-10-CM | POA: Diagnosis not present

## 2018-11-29 DIAGNOSIS — R079 Chest pain, unspecified: Secondary | ICD-10-CM | POA: Diagnosis not present

## 2018-12-06 DIAGNOSIS — I10 Essential (primary) hypertension: Secondary | ICD-10-CM | POA: Diagnosis not present

## 2018-12-06 DIAGNOSIS — Z79899 Other long term (current) drug therapy: Secondary | ICD-10-CM | POA: Diagnosis not present

## 2018-12-06 DIAGNOSIS — M62838 Other muscle spasm: Secondary | ICD-10-CM | POA: Diagnosis not present

## 2018-12-16 DIAGNOSIS — M5416 Radiculopathy, lumbar region: Secondary | ICD-10-CM | POA: Diagnosis not present

## 2018-12-16 DIAGNOSIS — M62830 Muscle spasm of back: Secondary | ICD-10-CM | POA: Diagnosis not present

## 2019-04-04 ENCOUNTER — Ambulatory Visit: Payer: Self-pay | Admitting: Podiatry

## 2019-04-04 DIAGNOSIS — Z20828 Contact with and (suspected) exposure to other viral communicable diseases: Secondary | ICD-10-CM | POA: Diagnosis not present

## 2019-04-11 DIAGNOSIS — R0789 Other chest pain: Secondary | ICD-10-CM | POA: Diagnosis not present

## 2019-04-11 DIAGNOSIS — R110 Nausea: Secondary | ICD-10-CM | POA: Diagnosis not present

## 2019-06-08 DIAGNOSIS — R197 Diarrhea, unspecified: Secondary | ICD-10-CM | POA: Diagnosis not present

## 2019-06-08 DIAGNOSIS — R6883 Chills (without fever): Secondary | ICD-10-CM | POA: Diagnosis not present

## 2019-06-09 ENCOUNTER — Emergency Department (HOSPITAL_COMMUNITY)
Admission: EM | Admit: 2019-06-09 | Discharge: 2019-06-09 | Disposition: A | Discharge status: Home / Self Care | Payer: BC Managed Care – PPO | Attending: Emergency Medicine | Admitting: Emergency Medicine

## 2019-06-09 ENCOUNTER — Emergency Department (HOSPITAL_COMMUNITY): Payer: BC Managed Care – PPO

## 2019-06-09 ENCOUNTER — Encounter (HOSPITAL_COMMUNITY): Payer: Self-pay | Admitting: Emergency Medicine

## 2019-06-09 ENCOUNTER — Other Ambulatory Visit: Payer: Self-pay

## 2019-06-09 DIAGNOSIS — R059 Cough, unspecified: Secondary | ICD-10-CM

## 2019-06-09 DIAGNOSIS — B349 Viral infection, unspecified: Secondary | ICD-10-CM

## 2019-06-09 DIAGNOSIS — Z79899 Other long term (current) drug therapy: Secondary | ICD-10-CM | POA: Diagnosis not present

## 2019-06-09 DIAGNOSIS — I10 Essential (primary) hypertension: Secondary | ICD-10-CM | POA: Diagnosis not present

## 2019-06-09 DIAGNOSIS — R6883 Chills (without fever): Secondary | ICD-10-CM | POA: Diagnosis not present

## 2019-06-09 DIAGNOSIS — Z20822 Contact with and (suspected) exposure to covid-19: Secondary | ICD-10-CM

## 2019-06-09 DIAGNOSIS — U071 COVID-19: Secondary | ICD-10-CM | POA: Diagnosis not present

## 2019-06-09 DIAGNOSIS — R509 Fever, unspecified: Secondary | ICD-10-CM | POA: Insufficient documentation

## 2019-06-09 DIAGNOSIS — R05 Cough: Secondary | ICD-10-CM

## 2019-06-09 DIAGNOSIS — R110 Nausea: Secondary | ICD-10-CM | POA: Diagnosis not present

## 2019-06-09 LAB — CBC
HCT: 38.9 % (ref 36.0–46.0)
Hemoglobin: 12.5 g/dL (ref 12.0–15.0)
MCH: 28.9 pg (ref 26.0–34.0)
MCHC: 32.1 g/dL (ref 30.0–36.0)
MCV: 89.8 fL (ref 80.0–100.0)
Platelets: 175 10*3/uL (ref 150–400)
RBC: 4.33 MIL/uL (ref 3.87–5.11)
RDW: 14 % (ref 11.5–15.5)
WBC: 3.5 10*3/uL — ABNORMAL LOW (ref 4.0–10.5)
nRBC: 0 % (ref 0.0–0.2)

## 2019-06-09 LAB — I-STAT BETA HCG BLOOD, ED (MC, WL, AP ONLY): I-stat hCG, quantitative: <5 m[IU]/mL (ref <5)

## 2019-06-09 LAB — COMPREHENSIVE METABOLIC PANEL
ALT: 32 U/L (ref 0–44)
AST: 29 U/L (ref 15–41)
Albumin: 3.7 g/dL (ref 3.5–5.0)
Alkaline Phosphatase: 71 U/L (ref 38–126)
Anion gap: 11 (ref 5–15)
BUN: 10 mg/dL (ref 6–20)
CO2: 26 mmol/L (ref 22–32)
Calcium: 8.9 mg/dL (ref 8.9–10.3)
Chloride: 102 mmol/L (ref 98–111)
Creatinine, Ser: 0.46 mg/dL (ref 0.44–1.00)
GFR calc Af Amer: >60 mL/min (ref >60)
GFR calc non Af Amer: >60 mL/min (ref >60)
Glucose, Bld: 95 mg/dL (ref 70–99)
Potassium: 3.6 mmol/L (ref 3.5–5.1)
Sodium: 139 mmol/L (ref 135–145)
Total Bilirubin: 0.8 mg/dL (ref 0.3–1.2)
Total Protein: 6.7 g/dL (ref 6.5–8.1)

## 2019-06-09 LAB — SARS CORONAVIRUS 2 BY RT PCR (HOSPITAL ORDER, PERFORMED IN ~~LOC~~ HOSPITAL LAB): SARS Coronavirus 2: POSITIVE — AB

## 2019-06-09 LAB — LIPASE, BLOOD: Lipase: 34 U/L (ref 11–51)

## 2019-06-09 MED ORDER — SODIUM CHLORIDE 0.9% FLUSH: 3.0000 mL | Freq: Once | INTRAVENOUS | Status: DC

## 2019-06-09 MED ORDER — ACETAMINOPHEN 325 MG PO TABS
650.0000 mg | ORAL_TABLET | Freq: Once | ORAL | Status: AC
  Administered 2019-06-09: 650 mg via ORAL
  Filled 2019-06-09: qty 2

## 2019-06-09 NOTE — ED Notes (Signed)
Pt given discharge instructions, follow up information and the opportunity to ask questions. Pt verbalized understanding. Pt educated on isolating at home and avoiding close contact with anyone to prevent spread of COVID. Pt verbalized understanding.

## 2019-06-09 NOTE — ED Provider Notes (Signed)
[facility] EMERGENCY DEPARTMENT Provider Note   CSN: [id number] Arrival date & time: 06/09/19  1420    History   Chief Complaint Chief Complaint  Patient presents with  . Abdominal Pain    HPI Pamela Miles is a 58 y.o. female.     The history is provided by the patient and medical records.  Illness Location:  Generalized Quality:  Chills, cough, fever, headache, nausea, diarrhea Severity:  Moderate Onset quality:  Gradual Duration:  5 days Timing:  Constant Progression:  Worsening Chronicity:  New Context:  Multiple exposures at her work for COVID-19 Associated symptoms: abdominal pain (mild, cramp-like), congestion, cough, diarrhea, fatigue, fever and nausea   Associated symptoms: no chest pain, no headaches, no shortness of breath, no sore throat and no wheezing      Past Medical History:  Diagnosis Date  . Hypertension     Patient Active Problem List   Diagnosis Date Noted  . Left ankle pain 08/23/2012  . Chest pain 05/28/2012  . Lower back injury 04/29/2012  . Neck injury 04/29/2012  . Strain of left Achilles tendon 04/29/2012    History reviewed. No pertinent surgical history.   OB History   No obstetric history on file.      Home Medications    Prior to Admission medications   Medication Sig Start Date End Date Taking? Authorizing Provider  lisinopril-hydrochlorothiazide (PRINZIDE,ZESTORETIC) 20-12.5 MG tablet Take 1 tablet by mouth daily.   Yes [provider]  verapamil (CALAN-SR) 240 MG CR tablet Take 240 mg by mouth at bedtime.   Yes [provider]  vitamin B-12 (CYANOCOBALAMIN) 100 MCG tablet Take 100 mcg by mouth daily.   Yes [provider]    Family History Family History  Problem Relation Age of Onset  . Hypertension Mother   . Sudden death Neg Hx   . Diabetes Neg Hx   . Heart attack Neg Hx   . Hyperlipidemia Neg Hx     Social History Social History   Tobacco Use  . Smoking  status: Never Smoker  . Smokeless tobacco: Never Used  Substance Use Topics  . Alcohol use: Yes  . Drug use: No     Allergies   Patient has no known allergies.   Review of Systems Review of Systems  Constitutional: Positive for activity change, appetite change, fatigue and fever.  HENT: Positive for congestion. Negative for mouth sores, sore throat, trouble swallowing and voice change.   Eyes: Negative.   Respiratory: Positive for cough. Negative for choking, chest tightness, shortness of breath and wheezing.   Cardiovascular: Negative for chest pain and palpitations.  Gastrointestinal: Positive for abdominal pain (mild, cramp-like), diarrhea and nausea. Negative for blood in stool.  Genitourinary: Negative.   Musculoskeletal: Negative.   Skin: Negative for color change and wound.  Neurological: Negative for dizziness, facial asymmetry, weakness, light-headedness and headaches.  Psychiatric/Behavioral: Negative.      Physical Exam Updated Vital Signs BP 112/68   Pulse 64   Temp 98.7 F (37.1 C) (Oral)   Resp 16   Ht 5\' 6"  (1.676 m)   Wt 104.3 kg   LMP 03/27/2012   SpO2 97%   BMI 37.12 kg/m   Physical Exam Vitals signs and nursing note reviewed.  Constitutional:      Appearance: She is obese. She is not toxic-appearing.  HENT:     Head: Normocephalic and atraumatic.     Mouth/Throat:     Mouth: Mucous membranes  are moist.  Eyes:     Extraocular Movements: Extraocular movements intact.  Cardiovascular:     Rate and Rhythm: Normal rate and regular rhythm.     Heart sounds: Normal heart sounds.  Pulmonary:     Effort: Pulmonary effort is normal. No respiratory distress.     Breath sounds: Rhonchi present.  Abdominal:     General: Abdomen is flat. There is no distension.     Palpations: Abdomen is soft.     Tenderness: There is no abdominal tenderness.  Skin:    General: Skin is warm.  Neurological:     General: No focal deficit present.     Mental Status:  She is alert.      ED Treatments / Results  Labs (all labs ordered are listed, but only abnormal results are displayed) Labs Reviewed  SARS CORONAVIRUS 2 (HOSPITAL ORDER, PERFORMED IN [facility]) - Abnormal; Notable for the following components:      Result Value   SARS Coronavirus 2 POSITIVE (*)    All other components within normal limits  CBC - Abnormal; Notable for the following components:   WBC 3.5 (*)    All other components within normal limits  LIPASE, BLOOD  COMPREHENSIVE METABOLIC PANEL  I-STAT BETA HCG BLOOD, ED ([facility], [facility], [facility] ONLY)    EKG None  Radiology Dg Chest Port 1 View  Result Date: 06/09/2019 CLINICAL DATA:  Diarrhea with chills nausea and abdominal pain for 4 days. EXAM: PORTABLE CHEST 1 VIEW COMPARISON:  05/27/2012 FINDINGS: 1652 hours. The lungs are clear without focal pneumonia, edema, pneumothorax or pleural effusion. The cardiopericardial silhouette is within normal limits for size. The visualized bony structures of the thorax are intact. IMPRESSION: No active disease. Electronically Signed   By: Kennith CenterEric  Mansell M.D.   On: 06/09/2019 17:12    Procedures Procedures (including critical care time)  Medications Ordered in ED Medications  acetaminophen (TYLENOL) tablet 650 mg (650 mg Oral Given 06/09/19 1724)     Initial Impression / Assessment and Plan / ED Course  I have reviewed the triage vital signs and the nursing notes.  Pertinent labs & imaging results that were available during my care of the patient were reviewed by me and considered in my medical decision making (see chart for details).     Pamela Miles is a 58 year old female patient who presented to this ED with a 5 day history of cough, nausea, malaise, myalgias, chills, rhinorrhea and diarrhea. She works in a Engineer, manufacturingwarehouse/factory setting and has been around other employees who have tested positive for the SARS-CoV-2 virus that causes COVID-19 illness. She was afebrile on arrive  to this ED and [facility]. Her symptoms are most consistent with an acute viral illness, and in the setting of the global coronavirus pandemic with fairly widespread community spread in this region, I am high suspicious that she has COVID-19 illness.   At this time she has no evidence of SOB, she is maintaining oxygen saturations in the upper 90's on room air. She has no signs of SIRS/SEPSIS at this time and is otherwise well and not requiring hospital admission at this time.   She was advised that she will need to self-quarantine / isolate to home for a period of at least 10 days and at least 3 days after resolution of symptoms.   She was evaluated in the context of the global COVID-19 pandemic, which necessitated consideration that the patient might be at risk for  infection with the SARS-CoV-2 virus that causes COVID-19. Institutional protocols and algorithms that pertain to the evaluation of patients at risk for COVID-19 are in a state of rapid change based on information released by regulatory bodies including the CDC and federal and state organizations. These policies and algorithms were followed during the patient's care in the ED.  The care of this patient was supervised by Dr. Gerhard Munchobert Lockwood, who agreed with the plan and management of the patient.     Final Clinical Impressions(s) / ED Diagnoses   Final diagnoses:  Viral illness  Suspected Covid-19 Virus Infection    ED Discharge Orders    None       Saverio DankerWilliams, Zyriah Mask A, MD 06/10/19 0131    Gerhard MunchLockwood, Robert, MD 06/14/19 1017

## 2019-06-09 NOTE — ED Notes (Signed)
Pt.. ambulated to restroom without help; pt. Was steady on her feet

## 2019-06-09 NOTE — ED Triage Notes (Signed)
Pt states she has had diarrhea, chills, nausea and abd pain for 4 days

## 2019-06-13 DIAGNOSIS — R110 Nausea: Secondary | ICD-10-CM | POA: Diagnosis not present

## 2019-06-13 DIAGNOSIS — U071 COVID-19: Secondary | ICD-10-CM | POA: Diagnosis not present

## 2019-06-23 DIAGNOSIS — Z1159 Encounter for screening for other viral diseases: Secondary | ICD-10-CM | POA: Diagnosis not present

## 2019-07-25 DIAGNOSIS — M7660 Achilles tendinitis, unspecified leg: Secondary | ICD-10-CM | POA: Diagnosis not present

## 2019-07-25 DIAGNOSIS — Z6835 Body mass index (BMI) 35.0-35.9, adult: Secondary | ICD-10-CM | POA: Diagnosis not present

## 2019-09-21 ENCOUNTER — Encounter (HOSPITAL_COMMUNITY): Payer: Self-pay | Admitting: *Deleted

## 2019-09-21 ENCOUNTER — Other Ambulatory Visit: Payer: Self-pay

## 2019-09-21 ENCOUNTER — Emergency Department (HOSPITAL_COMMUNITY)
Admission: EM | Admit: 2019-09-21 | Discharge: 2019-09-22 | Disposition: A | Discharge status: Home / Self Care | Payer: BC Managed Care – PPO | Attending: Emergency Medicine | Admitting: Emergency Medicine

## 2019-09-21 DIAGNOSIS — Z79899 Other long term (current) drug therapy: Secondary | ICD-10-CM | POA: Insufficient documentation

## 2019-09-21 DIAGNOSIS — I10 Essential (primary) hypertension: Secondary | ICD-10-CM | POA: Diagnosis not present

## 2019-09-21 DIAGNOSIS — R252 Cramp and spasm: Secondary | ICD-10-CM

## 2019-09-21 DIAGNOSIS — M791 Myalgia, unspecified site: Secondary | ICD-10-CM | POA: Diagnosis not present

## 2019-09-21 DIAGNOSIS — R52 Pain, unspecified: Secondary | ICD-10-CM | POA: Diagnosis not present

## 2019-09-21 DIAGNOSIS — R457 State of emotional shock and stress, unspecified: Secondary | ICD-10-CM | POA: Diagnosis not present

## 2019-09-21 NOTE — ED Triage Notes (Signed)
Pt arrives via [facility] from home. Sudden onset of cramping in her legs, starting to improve in route. Also have anxiety 164/88, hr 80, r 25, cbg 100, 97.6. Lungs clear.

## 2019-09-21 NOTE — ED Triage Notes (Signed)
Pt reporting leg cramping since this morning. She feels like the right leg is improving but having "leg spasms" in the left leg. Pt says that she tends to start hyperventilating when both leg are hurting her bad.

## 2019-09-22 LAB — COMPREHENSIVE METABOLIC PANEL
ALT: 42 U/L (ref 0–44)
AST: 29 U/L (ref 15–41)
Albumin: 4.5 g/dL (ref 3.5–5.0)
Alkaline Phosphatase: 95 U/L (ref 38–126)
Anion gap: 7 (ref 5–15)
BUN: 19 mg/dL (ref 6–20)
CO2: 25 mmol/L (ref 22–32)
Calcium: 10.3 mg/dL (ref 8.9–10.3)
Chloride: 104 mmol/L (ref 98–111)
Creatinine, Ser: 0.73 mg/dL (ref 0.44–1.00)
GFR calc Af Amer: >60 mL/min (ref >60)
GFR calc non Af Amer: >60 mL/min (ref >60)
Glucose, Bld: 103 mg/dL — ABNORMAL HIGH (ref 70–99)
Potassium: 4.1 mmol/L (ref 3.5–5.1)
Sodium: 136 mmol/L (ref 135–145)
Total Bilirubin: 0.6 mg/dL (ref 0.3–1.2)
Total Protein: 8.2 g/dL — ABNORMAL HIGH (ref 6.5–8.1)

## 2019-09-22 LAB — CBC
HCT: 46.1 % — ABNORMAL HIGH (ref 36.0–46.0)
Hemoglobin: 14.5 g/dL (ref 12.0–15.0)
MCH: 28.9 pg (ref 26.0–34.0)
MCHC: 31.5 g/dL (ref 30.0–36.0)
MCV: 91.8 fL (ref 80.0–100.0)
Platelets: 230 10*3/uL (ref 150–400)
RBC: 5.02 MIL/uL (ref 3.87–5.11)
RDW: 15 % (ref 11.5–15.5)
WBC: 7 10*3/uL (ref 4.0–10.5)
nRBC: 0 % (ref 0.0–0.2)

## 2019-09-22 LAB — CK: Total CK: 214 U/L (ref 38–234)

## 2019-09-22 LAB — MAGNESIUM: Magnesium: 2.2 mg/dL (ref 1.7–2.4)

## 2019-09-22 MED ORDER — CYCLOBENZAPRINE HCL 10 MG PO TABS: 10.0000 mg | ORAL_TABLET | Freq: Two times a day (BID) | ORAL | 0 refills | Status: DC | PRN

## 2019-09-22 NOTE — ED Provider Notes (Signed)
[facility]-EMERGENCY DEPT Provider Note   CSN: [id number] Arrival date & time: 09/21/19  1909     History   Chief Complaint Chief Complaint  Patient presents with  . Leg Pain    HPI Pamela Miles is a 58 y.o. female with a history of hypertension who presents to the emergency department by EMS with a chief complaint of muscle spasms.  The patient reports that she had 2 episodes of muscle spasming and cramping in her bilateral lower legs earlier today.  She reports that the intensity of the pain took her breath away and caused her to hyperventilate. She states that her breathing returned to normal soon as the muscle spasms in her leg is resolved.  She reports a longstanding history of muscle spasms in her legs with similar episodes of hyperventilating.  She reports that the episodes earlier today felt similar to previous episodes of muscle spasms.  She denies discolored or malodorous urine, numbness, weakness, diaphoresis, chest pain, cough, fever, chills, leg swelling, or palpitations.  She reports that she used to take Flexeril for muscle spasms, but no longer has a refill of the medication.     The history is provided by the patient. No language interpreter was used.    Past Medical History:  Diagnosis Date  . Hypertension     Patient Active Problem List   Diagnosis Date Noted  . Left ankle pain 08/23/2012  . Chest pain 05/28/2012  . Lower back injury 04/29/2012  . Neck injury 04/29/2012  . Strain of left Achilles tendon 04/29/2012    History reviewed. No pertinent surgical history.   OB History   No obstetric history on file.      Home Medications    Prior to Admission medications   Medication Sig Start Date End Date Taking? Authorizing Provider  lisinopril-hydrochlorothiazide (PRINZIDE,ZESTORETIC) 20-12.5 MG tablet Take 1 tablet by mouth daily.   Yes [provider]  Magnesium 250 MG TABS Take 250 mg by mouth 2 (two) times  daily.   Yes [provider]  Multiple Vitamin (MULTIVITAMIN WITH MINERALS) TABS tablet Take 1 tablet by mouth daily.   Yes [provider]  verapamil (CALAN-SR) 240 MG CR tablet Take 240 mg by mouth at bedtime.   Yes [provider]  vitamin B-12 (CYANOCOBALAMIN) 100 MCG tablet Take 100 mcg by mouth daily.   Yes [provider]  cyclobenzaprine (FLEXERIL) 10 MG tablet Take 1 tablet (10 mg total) by mouth 2 (two) times daily as needed for muscle spasms. 09/22/19   Dera Vanaken A, PA-C    Family History Family History  Problem Relation Age of Onset  . Hypertension Mother   . Sudden death Neg Hx   . Diabetes Neg Hx   . Heart attack Neg Hx   . Hyperlipidemia Neg Hx     Social History Social History   Tobacco Use  . Smoking status: Never Smoker  . Smokeless tobacco: Never Used  Substance Use Topics  . Alcohol use: Yes  . Drug use: No     Allergies   Patient has no known allergies.   Review of Systems Review of Systems  Constitutional: Negative for activity change, chills and fever.  Respiratory: Negative for shortness of breath.   Cardiovascular: Negative for chest pain.  Gastrointestinal: Negative for abdominal pain, constipation, diarrhea, nausea and vomiting.  Genitourinary: Negative for dysuria.  Musculoskeletal: Positive for myalgias. Negative for arthralgias, back pain, gait problem, joint swelling, neck pain and  neck stiffness.  Skin: Negative for rash.  Allergic/Immunologic: Negative for immunocompromised state.  Neurological: Negative for dizziness, seizures, syncope, weakness and headaches.  Psychiatric/Behavioral: Negative for confusion.     Physical Exam Updated Vital Signs BP 125/62   Pulse (!) 55   Temp 98.1 F (36.7 C) (Oral)   Resp 18   LMP 03/27/2012   SpO2 100%   Physical Exam Vitals signs and nursing note reviewed.  Constitutional:      General: She is not in acute distress.    Appearance: She is not  ill-appearing, toxic-appearing or diaphoretic.  HENT:     Head: Normocephalic.  Eyes:     Conjunctiva/sclera: Conjunctivae normal.  Neck:     Musculoskeletal: Neck supple.  Cardiovascular:     Rate and Rhythm: Normal rate and regular rhythm.     Pulses: Normal pulses.     Heart sounds: Normal heart sounds. No murmur. No friction rub. No gallop.      Comments: Heart is regular rate and rhythm without murmurs, rubs, or gallops. Pulmonary:     Effort: Pulmonary effort is normal. No respiratory distress.     Breath sounds: No stridor. No wheezing, rhonchi or rales.  Chest:     Chest wall: No tenderness.  Abdominal:     General: There is no distension.     Palpations: Abdomen is soft.  Musculoskeletal:        General: No tenderness.     Right lower leg: No edema.     Left lower leg: No edema.     Comments: DP and PT pulses are 2+ and symmetric.  Sensation is intact and equal.  Ambulatory without difficulty.  Joints of the bilateral lower extremities are nontender with full active and passive range of motion.  No calf swelling or tenderness.  Skin:    General: Skin is warm.     Findings: No rash.  Neurological:     Mental Status: She is alert.  Psychiatric:        Behavior: Behavior normal.      ED Treatments / Results  Labs (all labs ordered are listed, but only abnormal results are displayed) Labs Reviewed  COMPREHENSIVE METABOLIC PANEL - Abnormal; Notable for the following components:      Result Value   Glucose, Bld 103 (*)    Total Protein 8.2 (*)    All other components within normal limits  CBC - Abnormal; Notable for the following components:   HCT 46.1 (*)    All other components within normal limits  CK  MAGNESIUM    EKG None  Radiology No results found.  Procedures Procedures (including critical care time)  Medications Ordered in ED Medications - No data to display   Initial Impression / Assessment and Plan / ED Course  I have reviewed the triage  vital signs and the nursing notes.  Pertinent labs & imaging results that were available during my care of the patient were reviewed by me and considered in my medical decision making (see chart for details).        58 year old female with a history of hypertension presenting with 2 episodes of muscle spasms and cramping earlier today.  She reports a longstanding history of a similar that previously resolved with Flexeril, but she is out of the medication.  States that she hyperventilated due to the pain, but these symptoms have since resolved.  She has no shortness of breath or chest pain.  Labs are reassuring with no  electrolyte derangements. CK is normal.  Low suspicion for DVT.  Discussed the patient with Dr. Randal Buba, attending physician.  Will discharge with a short course of Flexeril and encourage close follow-up with primary care. ER return precautions given.  She is hemodynamically stable and in no acute distress.  Final Clinical Impressions(s) / ED Diagnoses   Final diagnoses:  Muscle cramps    ED Discharge Orders         Ordered    cyclobenzaprine (FLEXERIL) 10 MG tablet  2 times daily PRN     09/22/19 0153           Joanne Gavel, PA-C 09/22/19 0217    Palumbo, April, MD 09/22/19 2500

## 2019-09-22 NOTE — Discharge Instructions (Signed)
Thank you for allowing me to care for you today in the Emergency Department.   Take 1 tablet of Flexeril up to 2 times daily as needed for muscle spasms.  Do not work or drive until you know how this medication impacts you because it may make you drowsy.  Please follow closely with primary care if you continue to have muscle spasms.  Return to the emergency department if you develop respiratory distress, severe chest pain, if your urine turns very dark or brown, or if you develop other new, concerning symptoms.

## 2019-09-22 NOTE — ED Notes (Signed)
Pt ambulating to and from bathroom with steady gait

## 2019-10-27 DIAGNOSIS — M71571 Other bursitis, not elsewhere classified, right ankle and foot: Secondary | ICD-10-CM | POA: Diagnosis not present

## 2019-10-27 DIAGNOSIS — M7661 Achilles tendinitis, right leg: Secondary | ICD-10-CM | POA: Diagnosis not present

## 2019-10-27 DIAGNOSIS — M7731 Calcaneal spur, right foot: Secondary | ICD-10-CM | POA: Diagnosis not present

## 2019-11-07 DIAGNOSIS — M7661 Achilles tendinitis, right leg: Secondary | ICD-10-CM | POA: Diagnosis not present

## 2019-11-07 DIAGNOSIS — M71571 Other bursitis, not elsewhere classified, right ankle and foot: Secondary | ICD-10-CM | POA: Diagnosis not present

## 2019-12-14 DIAGNOSIS — M7732 Calcaneal spur, left foot: Secondary | ICD-10-CM | POA: Diagnosis not present

## 2019-12-14 DIAGNOSIS — M71572 Other bursitis, not elsewhere classified, left ankle and foot: Secondary | ICD-10-CM | POA: Diagnosis not present

## 2019-12-14 DIAGNOSIS — M7662 Achilles tendinitis, left leg: Secondary | ICD-10-CM | POA: Diagnosis not present

## 2019-12-30 DIAGNOSIS — M7662 Achilles tendinitis, left leg: Secondary | ICD-10-CM | POA: Diagnosis not present

## 2019-12-30 DIAGNOSIS — M71572 Other bursitis, not elsewhere classified, left ankle and foot: Secondary | ICD-10-CM | POA: Diagnosis not present

## 2019-12-31 IMAGING — DX PORTABLE CHEST - 1 VIEW
1 series · 1 of 1 positions shown · non-contrast
Comparison: 05/27/2012

CLINICAL DATA: Diarrhea with chills nausea and abdominal pain for 4
days.

EXAM:
PORTABLE CHEST 1 VIEW

[chest ap]
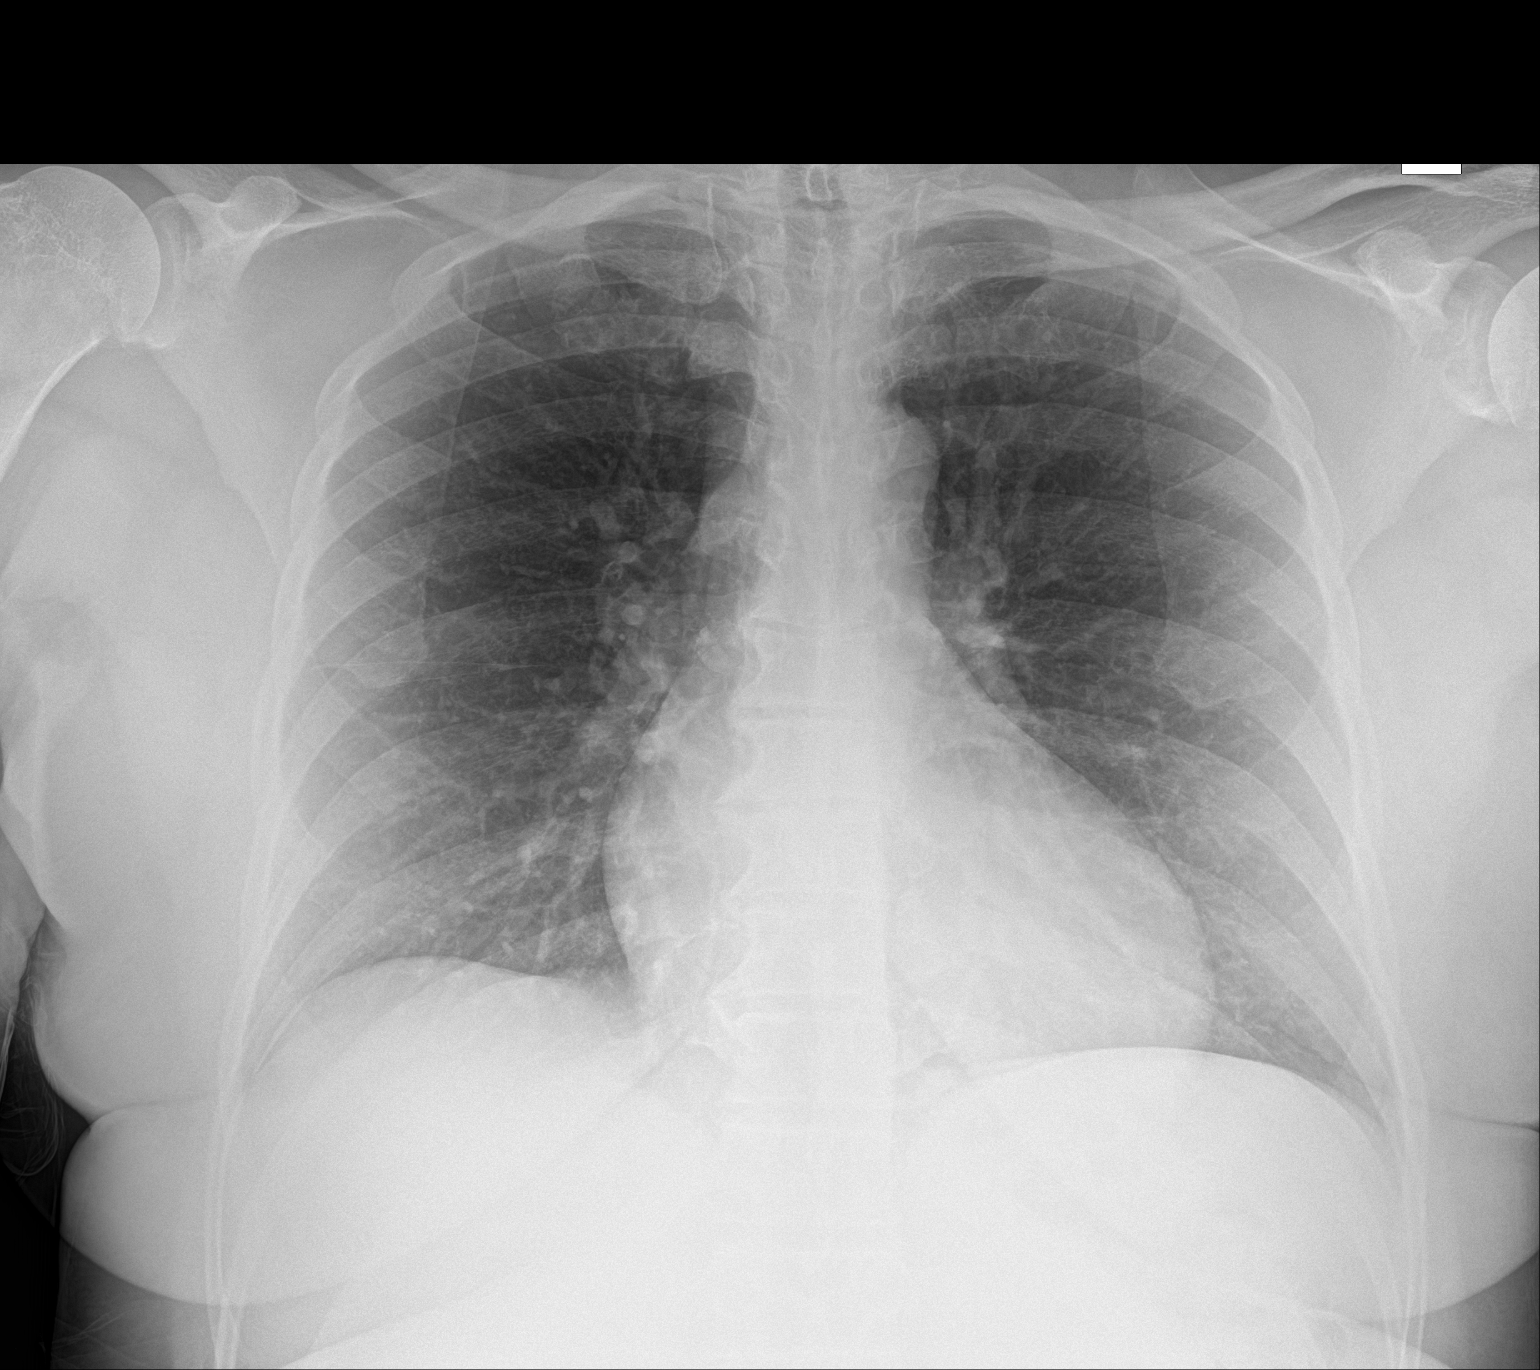

[1 of 1 positions shown; findings below may reference images not displayed]

FINDINGS: 1357 hours. The lungs are clear without focal pneumonia, edema,
pneumothorax or pleural effusion. The cardiopericardial silhouette
is within normal limits for size. The visualized bony structures of
the thorax are intact.
IMPRESSION: No active disease.

## 2020-03-02 DIAGNOSIS — I83893 Varicose veins of bilateral lower extremities with other complications: Secondary | ICD-10-CM | POA: Diagnosis not present

## 2020-03-08 ENCOUNTER — Other Ambulatory Visit: Payer: Self-pay

## 2020-03-08 ENCOUNTER — Emergency Department (HOSPITAL_COMMUNITY)
Admission: EM | Admit: 2020-03-08 | Discharge: 2020-03-08 | Disposition: A | Discharge status: Home / Self Care | Payer: BC Managed Care – PPO | Attending: Emergency Medicine | Admitting: Emergency Medicine

## 2020-03-08 ENCOUNTER — Encounter (HOSPITAL_COMMUNITY): Payer: Self-pay | Admitting: Emergency Medicine

## 2020-03-08 DIAGNOSIS — Z79899 Other long term (current) drug therapy: Secondary | ICD-10-CM | POA: Diagnosis not present

## 2020-03-08 DIAGNOSIS — Y929 Unspecified place or not applicable: Secondary | ICD-10-CM | POA: Insufficient documentation

## 2020-03-08 DIAGNOSIS — I10 Essential (primary) hypertension: Secondary | ICD-10-CM | POA: Diagnosis not present

## 2020-03-08 DIAGNOSIS — Y33XXXA Other specified events, undetermined intent, initial encounter: Secondary | ICD-10-CM | POA: Insufficient documentation

## 2020-03-08 DIAGNOSIS — Y939 Activity, unspecified: Secondary | ICD-10-CM | POA: Insufficient documentation

## 2020-03-08 DIAGNOSIS — I83891 Varicose veins of right lower extremities with other complications: Secondary | ICD-10-CM | POA: Diagnosis not present

## 2020-03-08 DIAGNOSIS — S81812A Laceration without foreign body, left lower leg, initial encounter: Secondary | ICD-10-CM | POA: Diagnosis not present

## 2020-03-08 DIAGNOSIS — Y999 Unspecified external cause status: Secondary | ICD-10-CM | POA: Diagnosis not present

## 2020-03-08 MED ORDER — LIDOCAINE-EPINEPHRINE (PF) 2 %-1:200000 IJ SOLN
10.0000 mL | Freq: Once | INTRAMUSCULAR | Status: AC
  Administered 2020-03-08: 10 mL
  Filled 2020-03-08: qty 10

## 2020-03-08 NOTE — Discharge Instructions (Addendum)
Suture out in 7 days.  Elevate leg.  If bleeding restarts, apply pressure

## 2020-03-08 NOTE — ED Provider Notes (Signed)
[facility] DEPT Provider Note   CSN: [id number] Arrival date & time: 03/08/20  1035     History Chief Complaint  Patient presents with  . Varicose Veins    Pamela Miles is a 59 y.o. female.  Chief complaint bleeding varicose vein on left lower extremity.  No other complaints.  Severity is moderate.  Patient states the blood was "spurting out".  She has applied pressure at home which helped.        Past Medical History:  Diagnosis Date  . Hypertension     Patient Active Problem List   Diagnosis Date Noted  . Left ankle pain 08/23/2012  . Chest pain 05/28/2012  . Lower back injury 04/29/2012  . Neck injury 04/29/2012  . Strain of left Achilles tendon 04/29/2012    History reviewed. No pertinent surgical history.   OB History   No obstetric history on file.     Family History  Problem Relation Age of Onset  . Hypertension Mother   . Sudden death Neg Hx   . Diabetes Neg Hx   . Heart attack Neg Hx   . Hyperlipidemia Neg Hx     Social History   Tobacco Use  . Smoking status: Never Smoker  . Smokeless tobacco: Never Used  Substance Use Topics  . Alcohol use: Yes  . Drug use: No    Home Medications Prior to Admission medications   Medication Sig Start Date End Date Taking? Authorizing Provider  cyclobenzaprine (FLEXERIL) 10 MG tablet Take 1 tablet (10 mg total) by mouth 2 (two) times daily as needed for muscle spasms. 09/22/19   McDonald, Mia A, PA-C  lisinopril-hydrochlorothiazide (PRINZIDE,ZESTORETIC) 20-12.5 MG tablet Take 1 tablet by mouth daily.    [provider]  Magnesium 250 MG TABS Take 250 mg by mouth 2 (two) times daily.    [provider]  Multiple Vitamin (MULTIVITAMIN WITH MINERALS) TABS tablet Take 1 tablet by mouth daily.    [provider]  verapamil (CALAN-SR) 240 MG CR tablet Take 240 mg by mouth at bedtime.    [provider]  vitamin B-12 (CYANOCOBALAMIN) 100 MCG  tablet Take 100 mcg by mouth daily.    [provider]    Allergies    Patient has no known allergies.  Review of Systems   Review of Systems  All other systems reviewed and are negative.   Physical Exam Updated Vital Signs BP (!) 148/96 (BP Location: Left Arm)   Pulse 66   Temp 98.6 F (37 C) (Oral)   Resp 18   LMP 03/27/2012   SpO2 100%   Physical Exam Vitals and nursing note reviewed.  Constitutional:      Appearance: She is well-developed.  HENT:     Head: Normocephalic and atraumatic.  Eyes:     Conjunctiva/sclera: Conjunctivae normal.  Cardiovascular:     Rate and Rhythm: Normal rate.  Pulmonary:     Effort: Pulmonary effort is normal.  Musculoskeletal:        General: Normal range of motion.     Cervical back: Neck supple.  Skin:    Comments: Left lower extremity: Obvious varicosity on the distal posterior lateral aspect of the extremity.  Good hemostasis  Neurological:     General: No focal deficit present.     Mental Status: She is alert and oriented to person, place, and time.  Psychiatric:        Behavior: Behavior normal.  ED Results / Procedures / Treatments   Labs (all labs ordered are listed, but only abnormal results are displayed) Labs Reviewed - No data to display  EKG None  Radiology No results found.  Procedures .Marland KitchenLaceration Repair  Date/Time: 03/08/2020 12:00 PM Performed by: Donnetta Hutching, MD Authorized by: Donnetta Hutching, MD   Consent:    Consent obtained:  Verbal   Consent given by:  Patient   Risks discussed:  Pain   Alternatives discussed:  No treatment Anesthesia (see MAR for exact dosages):    Anesthesia method:  Local infiltration   Local anesthetic:  Lidocaine 1% WITH epi Laceration details:    Location:  Leg   Wound length (cm): 1.0. Repair type:    Repair type:  Simple Pre-procedure details:    Preparation:  Patient was prepped and draped in usual sterile fashion Exploration:    Hemostasis achieved  with:  Epinephrine   Contaminated: no   Treatment:    Area cleansed with:  Saline   Irrigation solution:  Sterile saline Skin repair:    Repair method:  Sutures   Suture size:  4-0   Suture material:  Prolene Approximation:    Approximation:  Close Post-procedure details:    Dressing:  Sterile dressing Comments:     Pursestring suture applied to varicosity with good results.   (including critical care time)  Medications Ordered in ED Medications  lidocaine-EPINEPHrine (XYLOCAINE W/EPI) 2 %-1:200000 (PF) injection 10 mL (10 mLs Infiltration Given 03/08/20 1204)    ED Course  I have reviewed the triage vital signs and the nursing notes.  Pertinent labs & imaging results that were available during my care of the patient were reviewed by me and considered in my medical decision making (see chart for details).    MDM Rules/Calculators/A&P                      Status post bleeding varicose vein.  Pursestring suture applied.  Sterile dressing. Final Clinical Impression(s) / ED Diagnoses Final diagnoses:  Laceration of left lower extremity, initial encounter    Rx / DC Orders ED Discharge Orders    None       Donnetta Hutching, MD 03/08/20 1228

## 2020-03-08 NOTE — ED Triage Notes (Signed)
Per pt, states she has varicose veins-left lower vein started bleeding after she took a shower, bleeding controlled at this time

## 2020-03-20 DIAGNOSIS — I8312 Varicose veins of left lower extremity with inflammation: Secondary | ICD-10-CM | POA: Diagnosis not present

## 2020-03-20 DIAGNOSIS — I8311 Varicose veins of right lower extremity with inflammation: Secondary | ICD-10-CM | POA: Diagnosis not present

## 2020-03-20 DIAGNOSIS — R58 Hemorrhage, not elsewhere classified: Secondary | ICD-10-CM | POA: Diagnosis not present

## 2020-03-26 DIAGNOSIS — I8311 Varicose veins of right lower extremity with inflammation: Secondary | ICD-10-CM | POA: Diagnosis not present

## 2020-03-26 DIAGNOSIS — I8312 Varicose veins of left lower extremity with inflammation: Secondary | ICD-10-CM | POA: Diagnosis not present

## 2020-03-26 DIAGNOSIS — R58 Hemorrhage, not elsewhere classified: Secondary | ICD-10-CM | POA: Diagnosis not present

## 2020-04-11 ENCOUNTER — Other Ambulatory Visit: Payer: Self-pay | Admitting: Family Medicine

## 2020-04-11 DIAGNOSIS — Z1231 Encounter for screening mammogram for malignant neoplasm of breast: Secondary | ICD-10-CM

## 2020-04-11 DIAGNOSIS — I8312 Varicose veins of left lower extremity with inflammation: Secondary | ICD-10-CM | POA: Diagnosis not present

## 2020-04-19 ENCOUNTER — Other Ambulatory Visit: Payer: Self-pay

## 2020-04-19 ENCOUNTER — Ambulatory Visit
Admission: RE | Admit: 2020-04-19 | Discharge: 2020-04-19 | Disposition: A | Discharge status: Home / Self Care | Payer: BC Managed Care – PPO | Source: Ambulatory Visit | Attending: Family Medicine | Admitting: Family Medicine

## 2020-04-19 DIAGNOSIS — Z1231 Encounter for screening mammogram for malignant neoplasm of breast: Secondary | ICD-10-CM

## 2020-04-27 DIAGNOSIS — M62838 Other muscle spasm: Secondary | ICD-10-CM | POA: Diagnosis not present

## 2020-04-27 DIAGNOSIS — R197 Diarrhea, unspecified: Secondary | ICD-10-CM | POA: Diagnosis not present

## 2020-04-27 DIAGNOSIS — K529 Noninfective gastroenteritis and colitis, unspecified: Secondary | ICD-10-CM | POA: Diagnosis not present

## 2020-04-27 DIAGNOSIS — Z20818 Contact with and (suspected) exposure to other bacterial communicable diseases: Secondary | ICD-10-CM | POA: Diagnosis not present

## 2020-05-16 DIAGNOSIS — I8312 Varicose veins of left lower extremity with inflammation: Secondary | ICD-10-CM | POA: Diagnosis not present

## 2020-05-18 DIAGNOSIS — I8312 Varicose veins of left lower extremity with inflammation: Secondary | ICD-10-CM | POA: Diagnosis not present

## 2020-05-22 DIAGNOSIS — I8312 Varicose veins of left lower extremity with inflammation: Secondary | ICD-10-CM | POA: Diagnosis not present

## 2020-06-04 ENCOUNTER — Ambulatory Visit (INDEPENDENT_AMBULATORY_CARE_PROVIDER_SITE_OTHER): Payer: BC Managed Care – PPO

## 2020-06-04 ENCOUNTER — Other Ambulatory Visit: Payer: Self-pay

## 2020-06-04 ENCOUNTER — Ambulatory Visit: Payer: BC Managed Care – PPO | Admitting: Podiatry

## 2020-06-04 ENCOUNTER — Other Ambulatory Visit: Payer: Self-pay | Admitting: Podiatry

## 2020-06-04 DIAGNOSIS — M7732 Calcaneal spur, left foot: Secondary | ICD-10-CM

## 2020-06-04 DIAGNOSIS — M7662 Achilles tendinitis, left leg: Secondary | ICD-10-CM

## 2020-06-04 MED ORDER — MELOXICAM 15 MG PO TABS: 15.0000 mg | ORAL_TABLET | Freq: Every day | ORAL | 1 refills | Status: DC

## 2020-06-04 NOTE — Progress Notes (Signed)
   HPI: 59 y.o. female presenting today as a new patient referral from Dr. Elijah Birk, podiatrist, for pain to the posterior left heel that is been going on for several years off and on now.  The patient has been treated with multiple conservative modalities including anti-inflammatory injections, oral anti-inflammatories, shoe gear modification and she has not received any lasting relief.  She was present today for possible surgical correction surgical consult associated to the left posterior heel pain.  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   Past Medical History:  Diagnosis Date  . Hypertension    Physical Exam: General: The patient is alert and oriented x3 in no acute distress.  Dermatology: Skin is warm, dry and supple bilateral lower extremities. Negative for open lesions or macerations.  Vascular: Palpable pedal pulses bilaterally. No edema or erythema noted. Capillary refill within normal limits.  Neurological: Epicritic and protective threshold grossly intact bilaterally.   Musculoskeletal Exam: Pain on palpation noted to the posterior tubercle of the left calcaneus at the insertion of the Achilles tendon consistent with retrocalcaneal bursitis. Range of motion within normal limits. Muscle strength 5/5 in all muscle groups bilateral lower extremities.  Radiographic Exam:  Posterior calcaneal spur noted to the respective calcaneus on lateral view. No fracture or dislocation noted. Normal osseous mineralization noted.     Assessment: 1. Insertional Achilles tendinitis left 2. Retrocalcaneal bursitis   Plan of Care:  1. Patient was evaluated. Radiographs were reviewed today. 2.  The patient has tried multiple conservative modalities including immobilization cam boot, anti-inflammatory injections, oral anti-inflammatories, and shoe gear modifications without any lasting relief.  The patient has had the symptoms off and on for several years now.  I do  believe the patient would be a good surgical candidate and she has exhausted all conservative modalities.  All possible complications and details of procedure were explained.  No guarantees were expressed or implied.  All patient questions answered.  The patient opted for surgical correction. 3.  Authorization for surgery initiated today.  Surgery will consist of retrocalcaneal exostectomy left.  Repair of Achilles tendon left. 4.  Prescription for meloxicam 15 mg daily  5.  Return to clinic 1 week postop  *Works on the Hotel manager at [employer]   Felecia Shelling, [place] [facility]  Dr. Felecia Shelling, DPM    [address]                                        [place], [place] [address]                Office [phone]  Fax [phone]

## 2020-06-06 DIAGNOSIS — I8312 Varicose veins of left lower extremity with inflammation: Secondary | ICD-10-CM | POA: Diagnosis not present

## 2020-06-13 ENCOUNTER — Encounter: Payer: Self-pay | Admitting: Podiatry

## 2020-06-25 DIAGNOSIS — M79676 Pain in unspecified toe(s): Secondary | ICD-10-CM

## 2020-06-26 DIAGNOSIS — I8312 Varicose veins of left lower extremity with inflammation: Secondary | ICD-10-CM | POA: Diagnosis not present

## 2020-06-26 DIAGNOSIS — M7981 Nontraumatic hematoma of soft tissue: Secondary | ICD-10-CM | POA: Diagnosis not present

## 2020-07-15 ENCOUNTER — Other Ambulatory Visit: Payer: Self-pay | Admitting: Podiatry

## 2020-07-15 DIAGNOSIS — M7732 Calcaneal spur, left foot: Secondary | ICD-10-CM

## 2020-07-15 DIAGNOSIS — M7662 Achilles tendinitis, left leg: Secondary | ICD-10-CM

## 2020-07-15 NOTE — Progress Notes (Signed)
Wheel chair order

## 2020-07-24 ENCOUNTER — Telehealth: Payer: Self-pay | Admitting: Podiatry

## 2020-07-24 NOTE — Telephone Encounter (Signed)
Pt called saying that [employer], her STD/FMLA carrier asked if she was getting a COVID test done prior to sx. Stated most doctors are requiring it. I told her with sx being performed at [facility] they are only COVID testing if ordered by the doctor or if they are being intubated, but I would follow up with them to confirm and call her back once I have an answer.

## 2020-07-24 NOTE — Telephone Encounter (Signed)
Left voicemail for pt letting her know that Aram Beecham at [facility] confirmed they are only doing COVID tests if the doctor orders it or they are intubated for sx. Told her to confirm with [employer] that if they are wanting her to do a COVID test she could go several days before sx to a pharmacy such as [facility] or [facility] and test there, or, we could have Dr. Logan Bores place an order for her to get tested the week before at [facility] and then she would have to quarantine for 5 days before sx. Told pt to call with any other questions and/or concerns.

## 2020-08-03 ENCOUNTER — Telehealth: Payer: Self-pay | Admitting: Podiatry

## 2020-08-03 NOTE — Telephone Encounter (Signed)
Pt called about follow up of surgery information. Stated she is supposed to get a knee scooter and wanted to follow up with that. Told her I would send a message someone in clinic to place the order for that and someone from the home health that would rent it would be in touch with her regarding insurance and pricing. Told pt she could go ahead and register online via [employer] for sx with [facility]. Told pt if she needed crutches, they can give those to her at [facility]. Told pt to call with any other questions/concerns.

## 2020-08-20 ENCOUNTER — Telehealth: Payer: Self-pay

## 2020-08-20 ENCOUNTER — Other Ambulatory Visit: Payer: Self-pay | Admitting: Podiatry

## 2020-08-20 DIAGNOSIS — M7662 Achilles tendinitis, left leg: Secondary | ICD-10-CM

## 2020-08-20 DIAGNOSIS — M7732 Calcaneal spur, left foot: Secondary | ICD-10-CM

## 2020-08-20 NOTE — Telephone Encounter (Signed)
DOS 08/23/2020  REPAIR ACHILLES LT - 21224 CALCANEAL OSTECTOMY LT - 82500  BCBS EFFECTIVE DATE - 09/07/2017  PLAN DEDUCTIBLE - $0.00 OUT OF POCKET - $7500.00 W/ $4938.00 REMAINING COPAY $0.00 COINSURANCE - 20%  PER AUTOMATED SYSTEM NO PRECERT REQUIRED FOR CPT 37048 OR [address]16. CONFORMATION # [id number]

## 2020-08-20 NOTE — Progress Notes (Signed)
PRN postop 

## 2020-08-22 ENCOUNTER — Encounter: Payer: BC Managed Care – PPO | Admitting: Podiatry

## 2020-08-22 DIAGNOSIS — M7732 Calcaneal spur, left foot: Secondary | ICD-10-CM | POA: Diagnosis not present

## 2020-08-22 DIAGNOSIS — M7662 Achilles tendinitis, left leg: Secondary | ICD-10-CM | POA: Diagnosis not present

## 2020-08-23 ENCOUNTER — Other Ambulatory Visit: Payer: Self-pay | Admitting: Podiatry

## 2020-08-23 ENCOUNTER — Encounter: Payer: Self-pay | Admitting: Podiatry

## 2020-08-23 DIAGNOSIS — M7732 Calcaneal spur, left foot: Secondary | ICD-10-CM | POA: Diagnosis not present

## 2020-08-23 DIAGNOSIS — M7662 Achilles tendinitis, left leg: Secondary | ICD-10-CM | POA: Diagnosis not present

## 2020-08-23 DIAGNOSIS — M25572 Pain in left ankle and joints of left foot: Secondary | ICD-10-CM | POA: Diagnosis not present

## 2020-08-23 MED ORDER — IBUPROFEN 800 MG PO TABS: 800.0000 mg | ORAL_TABLET | Freq: Three times a day (TID) | ORAL | 1 refills | Status: DC

## 2020-08-23 MED ORDER — OXYCODONE-ACETAMINOPHEN 5-325 MG PO TABS: 1.0000 | ORAL_TABLET | ORAL | 0 refills | Status: DC | PRN

## 2020-08-23 NOTE — Progress Notes (Signed)
PRN postop 

## 2020-08-29 ENCOUNTER — Encounter: Payer: BC Managed Care – PPO | Admitting: Podiatry

## 2020-08-29 ENCOUNTER — Ambulatory Visit (INDEPENDENT_AMBULATORY_CARE_PROVIDER_SITE_OTHER): Payer: BC Managed Care – PPO | Admitting: Podiatry

## 2020-08-29 ENCOUNTER — Ambulatory Visit (INDEPENDENT_AMBULATORY_CARE_PROVIDER_SITE_OTHER): Payer: BC Managed Care – PPO

## 2020-08-29 ENCOUNTER — Other Ambulatory Visit: Payer: Self-pay

## 2020-08-29 DIAGNOSIS — M7732 Calcaneal spur, left foot: Secondary | ICD-10-CM

## 2020-08-29 DIAGNOSIS — M7662 Achilles tendinitis, left leg: Secondary | ICD-10-CM | POA: Diagnosis not present

## 2020-08-29 DIAGNOSIS — Z9889 Other specified postprocedural states: Secondary | ICD-10-CM

## 2020-08-29 NOTE — Progress Notes (Signed)
   Subjective:  Patient presents today status post retrocalcaneal exostectomy with repair of Achilles tendon left. DOS: 08/23/2020.  Patient states that she is doing very well.  The cast is intact today.  She has been nonweightbearing with the assistance of a wheelchair.  The pain is tolerated with ibuprofen and Percocet.  No new complaints at this time.  Past Medical History:  Diagnosis Date  . Hypertension       Objective/Physical Exam Neurovascular status intact.  Cast left intact today.  Patient can wiggle her toes.  Capillary refill within normal limits.  No sign of infectious process noted. No active bleeding noted.  Radiographic Exam:  Retrocalcaneal exostectomy noted with removal of the posterior heel spur based on prior exam.  Assessment: 1. s/p retrocalcaneal exostectomy with repair of Achilles tendon left. DOS: 08/23/2020   Plan of Care:  1. Patient was evaluated. X-rays reviewed 2.  Cast left intact today 3.  Continue nonweightbearing using a knee scooter and wheelchair 4.  Continue Motrin 800 mg and Percocet 5/325 mg as needed pain 5.  Return to clinic in 2 weeks   Felecia Shelling, DPM [facility]  Dr. Felecia Shelling, DPM    [address]                                        [place], [place] [address]                Office [phone]  Fax [phone]

## 2020-09-05 ENCOUNTER — Encounter: Payer: BC Managed Care – PPO | Admitting: Podiatry

## 2020-09-12 ENCOUNTER — Ambulatory Visit (INDEPENDENT_AMBULATORY_CARE_PROVIDER_SITE_OTHER): Payer: BC Managed Care – PPO | Admitting: Podiatry

## 2020-09-12 ENCOUNTER — Other Ambulatory Visit: Payer: Self-pay

## 2020-09-12 ENCOUNTER — Encounter: Payer: BC Managed Care – PPO | Admitting: Podiatry

## 2020-09-12 DIAGNOSIS — M7662 Achilles tendinitis, left leg: Secondary | ICD-10-CM | POA: Diagnosis not present

## 2020-09-12 DIAGNOSIS — Z9889 Other specified postprocedural states: Secondary | ICD-10-CM

## 2020-09-12 DIAGNOSIS — M7732 Calcaneal spur, left foot: Secondary | ICD-10-CM

## 2020-09-12 NOTE — Progress Notes (Signed)
   Subjective:  Patient presents today status post retrocalcaneal exostectomy with repair of Achilles tendon left. DOS: 08/23/2020.  Patient states that she is doing very well.  The cast is intact today.  She has been nonweightbearing with the assistance of a knee scooter and walker.  Patient does states she does have some intermittent swelling to the foot.  No new complaints at this time  Past Medical History:  Diagnosis Date  . Hypertension       Objective/Physical Exam Neurovascular status intact.  Cast was bivalved and removed today.  Skin incision is well coapted with sutures intact.  Moderate edema noted throughout the foot and ankle.    Assessment: 1. s/p retrocalcaneal exostectomy with repair of Achilles tendon left. DOS: 08/23/2020   Plan of Care:  1. Patient was evaluated.  Cast was bivalved and removed today 2.  Continue nonweightbearing using a knee scooter, wheelchair, and walker 3.  CAM boot with felt heel lift applied.  Wear daily with ace wrap.  Continue nonweightbearing.   4.  Continue Motrin 800 mg as needed pain 5.  Return to clinic in 2 weeks for suture removal and follow-up x-ray   Felecia Shelling, DPM [facility]  Dr. Felecia Shelling, DPM    [address]                                        [place], [place] [address]                Office [phone]  Fax [phone]

## 2020-09-19 ENCOUNTER — Ambulatory Visit (INDEPENDENT_AMBULATORY_CARE_PROVIDER_SITE_OTHER): Payer: BC Managed Care – PPO | Admitting: Podiatry

## 2020-09-19 ENCOUNTER — Ambulatory Visit (INDEPENDENT_AMBULATORY_CARE_PROVIDER_SITE_OTHER): Payer: BC Managed Care – PPO

## 2020-09-19 ENCOUNTER — Other Ambulatory Visit: Payer: Self-pay

## 2020-09-19 DIAGNOSIS — Z9889 Other specified postprocedural states: Secondary | ICD-10-CM

## 2020-09-19 DIAGNOSIS — M7732 Calcaneal spur, left foot: Secondary | ICD-10-CM

## 2020-09-19 DIAGNOSIS — M7662 Achilles tendinitis, left leg: Secondary | ICD-10-CM | POA: Diagnosis not present

## 2020-09-19 NOTE — Progress Notes (Signed)
   Subjective:  Patient presents today status post retrocalcaneal exostectomy with repair of Achilles tendon left. DOS: 08/23/2020.  Patient states that she is doing well however she has some increased pain over the last week.  She has been wearing the cam boot and staying off of her foot with the assistance of the knee scooter and walker.  No new complaints at this time  Past Medical History:  Diagnosis Date  . Hypertension       Objective/Physical Exam Neurovascular status intact.  Cast was bivalved and removed today.  Skin incision is well coapted with sutures intact.  Moderate edema noted throughout the foot and ankle.    Assessment: 1. s/p retrocalcaneal exostectomy with repair of Achilles tendon left. DOS: 08/23/2020   Plan of Care:  1. Patient was evaluated.   2. Sutures removed today 3. Continue NWB in CAM x 2 weeks. Beginning 10/03/2020 she may begin WB in the CAM   Felecia Shelling, DPM [facility]  Dr. Felecia Shelling, DPM    [address]                                        [place], [place] [address]                Office [phone]  Fax [phone]

## 2020-09-19 NOTE — Patient Instructions (Signed)
Continue nonweight bearing in the CAM boot until 10/03/2020.  At that time you may begin weightbearing in the cam boot

## 2020-09-20 ENCOUNTER — Encounter: Payer: Self-pay | Admitting: Podiatry

## 2020-09-20 ENCOUNTER — Telehealth: Payer: Self-pay

## 2020-09-20 NOTE — Telephone Encounter (Signed)
Patient called to report that she is still having throbbing pain and swelling. Advised to elevate and then dangle her foot every once in a while. Also advised ok to loosen ace wrap a little. And to also ice it a couple times a day. Patient voiced understanding. Advised to call back with any further questions or problems. Patient reports that she still has some pain medicine on hand.

## 2020-09-21 DIAGNOSIS — M7732 Calcaneal spur, left foot: Secondary | ICD-10-CM | POA: Diagnosis not present

## 2020-09-21 DIAGNOSIS — M7662 Achilles tendinitis, left leg: Secondary | ICD-10-CM | POA: Diagnosis not present

## 2020-09-27 ENCOUNTER — Telehealth: Payer: Self-pay

## 2020-09-27 NOTE — Telephone Encounter (Signed)
Patient called, asking for refill of oxycodone. Please send or advise thanks

## 2020-10-22 ENCOUNTER — Ambulatory Visit (INDEPENDENT_AMBULATORY_CARE_PROVIDER_SITE_OTHER): Payer: BC Managed Care – PPO

## 2020-10-22 ENCOUNTER — Encounter: Payer: BC Managed Care – PPO | Admitting: Podiatry

## 2020-10-22 ENCOUNTER — Ambulatory Visit (INDEPENDENT_AMBULATORY_CARE_PROVIDER_SITE_OTHER): Payer: BC Managed Care – PPO | Admitting: Podiatry

## 2020-10-22 ENCOUNTER — Other Ambulatory Visit: Payer: Self-pay

## 2020-10-22 DIAGNOSIS — M7662 Achilles tendinitis, left leg: Secondary | ICD-10-CM | POA: Diagnosis not present

## 2020-10-22 DIAGNOSIS — M7732 Calcaneal spur, left foot: Secondary | ICD-10-CM

## 2020-10-22 DIAGNOSIS — Z9889 Other specified postprocedural states: Secondary | ICD-10-CM

## 2020-10-22 MED ORDER — OXYCODONE-ACETAMINOPHEN 5-325 MG PO TABS
1.0000 | ORAL_TABLET | ORAL | 0 refills | Status: DC | PRN
Start: 2020-10-22 — End: 2022-03-05

## 2020-10-22 NOTE — Progress Notes (Signed)
   Subjective:  Patient presents today status post retrocalcaneal exostectomy with repair of Achilles tendon left. DOS: 08/23/2020.  Patient has been weightbearing in the cam boot as instructed.  She states that she has had a couple of slip and fall injuries.  Otherwise she is doing very well.  No new complaints at this time Past Medical History:  Diagnosis Date  . Hypertension       Objective/Physical Exam Neurovascular status intact.  Skin incision has healed completely.  Negative for any significant edema throughout the foot or ankle.  Negative for pain on palpation throughout the posterior heel.  Radiographic exam Cortices are intact.  Posterior tubercle mostly unchanged since prior x-rays.  Routine healing noted.   Assessment: 1. s/p retrocalcaneal exostectomy with repair of Achilles tendon left. DOS: 08/23/2020   Plan of Care:  1. Patient was evaluated.   2.  Order placed today for physical therapy at [facility] 3.  Patient may transition out of the cam boot into good supportive tennis shoes 4.  Return to clinic in 6 weeks  Felecia Shelling, DPM [facility]  Dr. Felecia Shelling, DPM    [address] [place], [place] [address]                Office [phone]  Fax [phone]

## 2020-11-10 IMAGING — MG DIGITAL SCREENING BILAT W/ TOMO W/ CAD
8 series · 8 of 24 positions shown · non-contrast
Comparison: Previous exam(s).

CLINICAL DATA: Screening.

EXAM:
DIGITAL SCREENING BILATERAL MAMMOGRAM WITH TOMO AND CAD

[R MLO synth-2D]
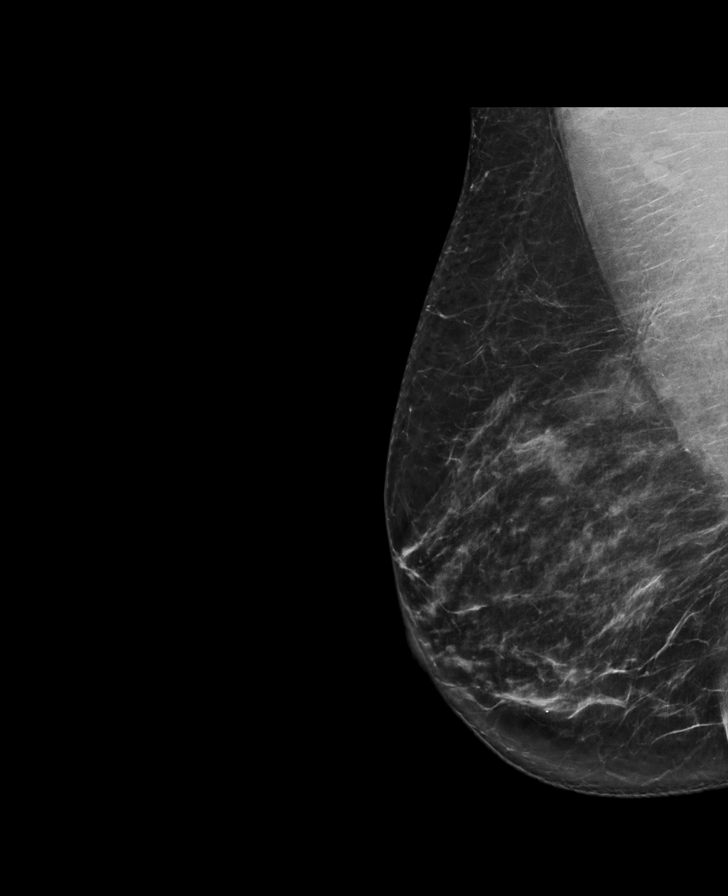

[L CC synth-2D]
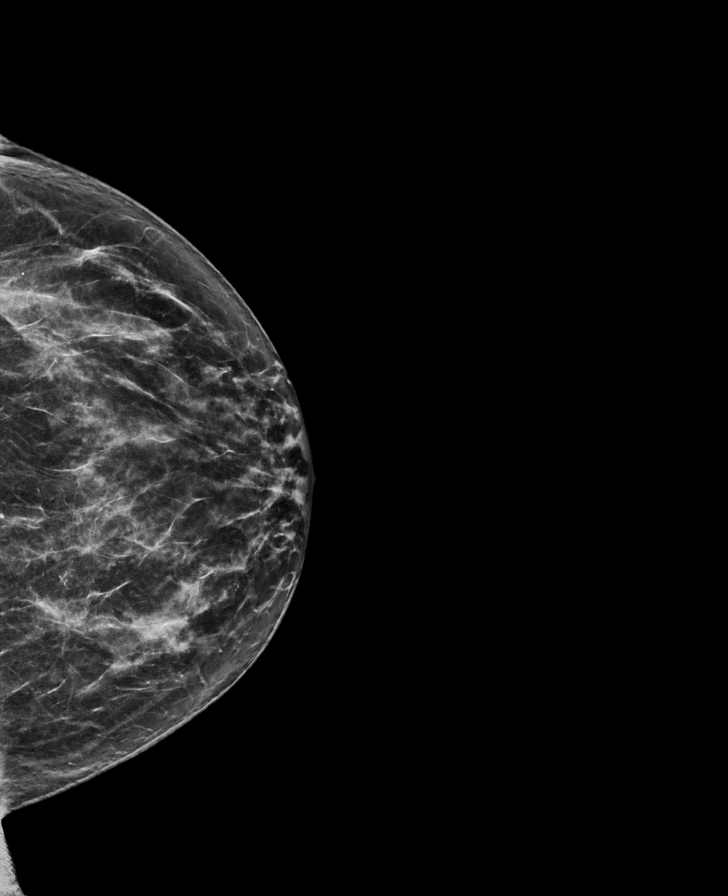

[L MLO synth-2D]
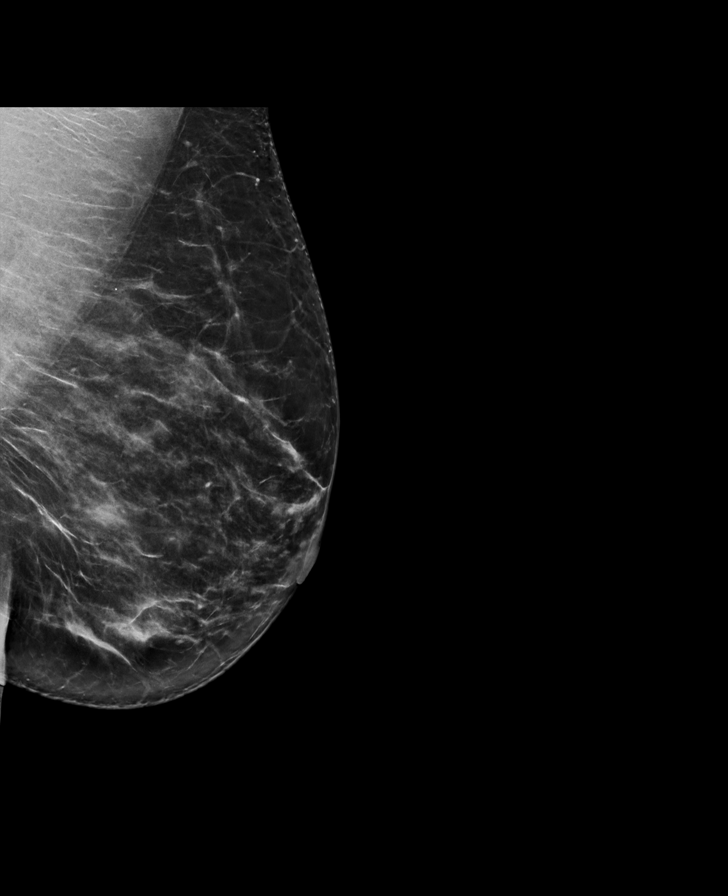

[R CC synth-2D]
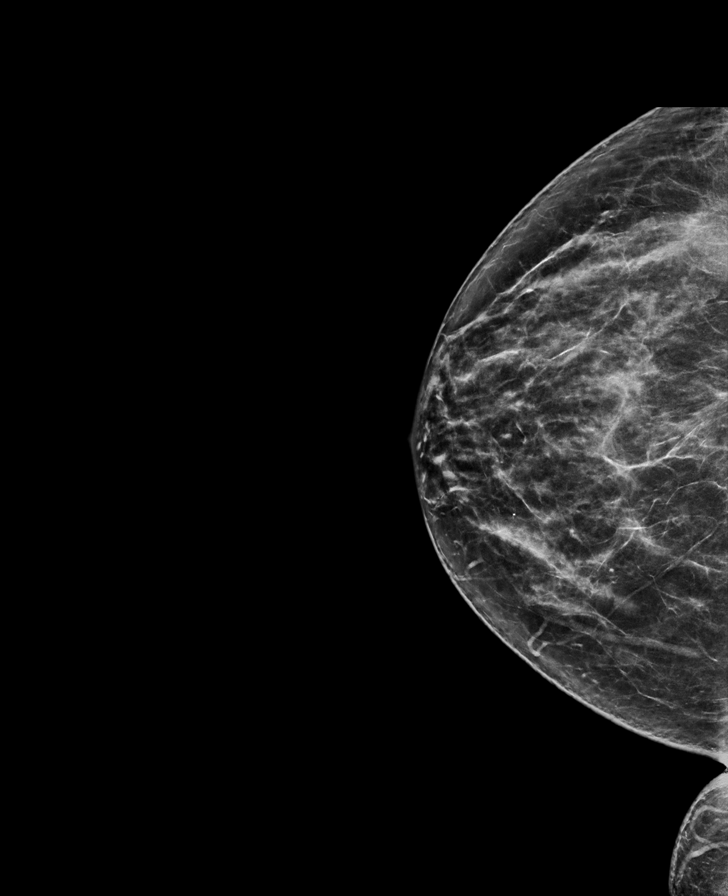

[R MLO tomo · tomo slice 40/79.0]
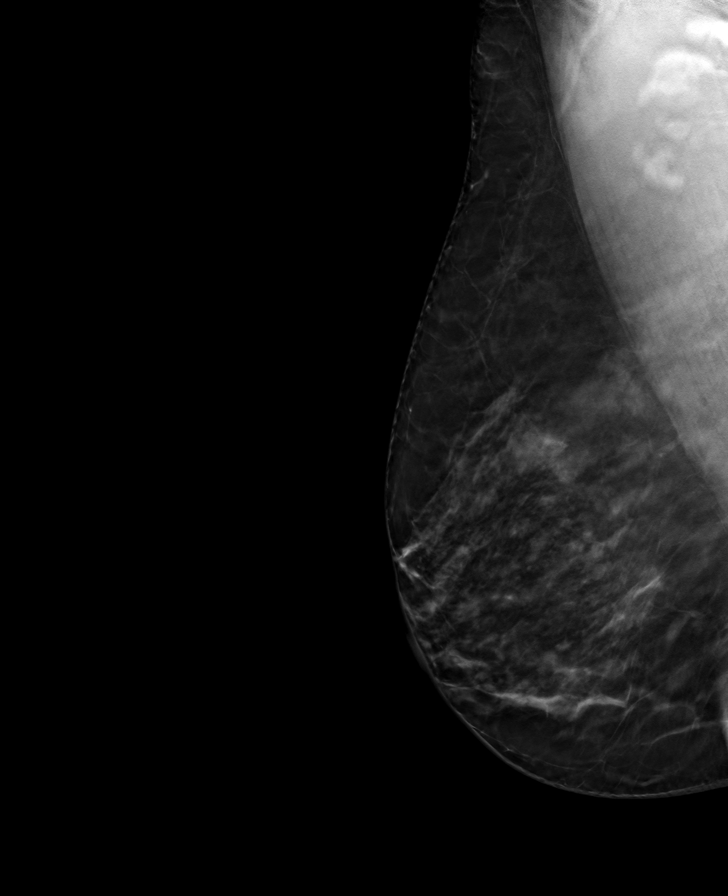

[R CC tomo · tomo slice 38/75.0]
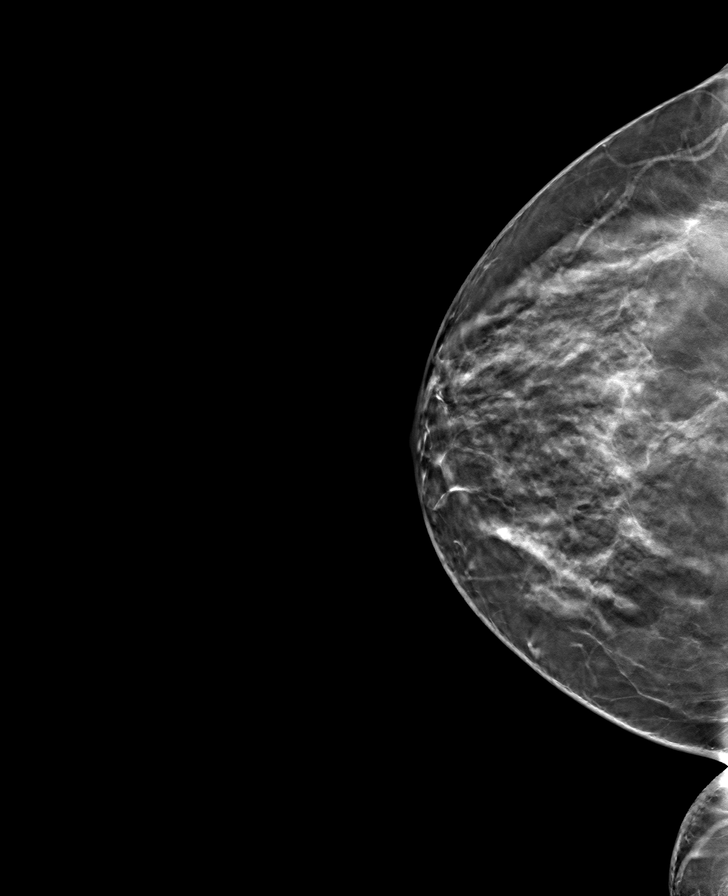

[L CC tomo · tomo slice 36/71.0]
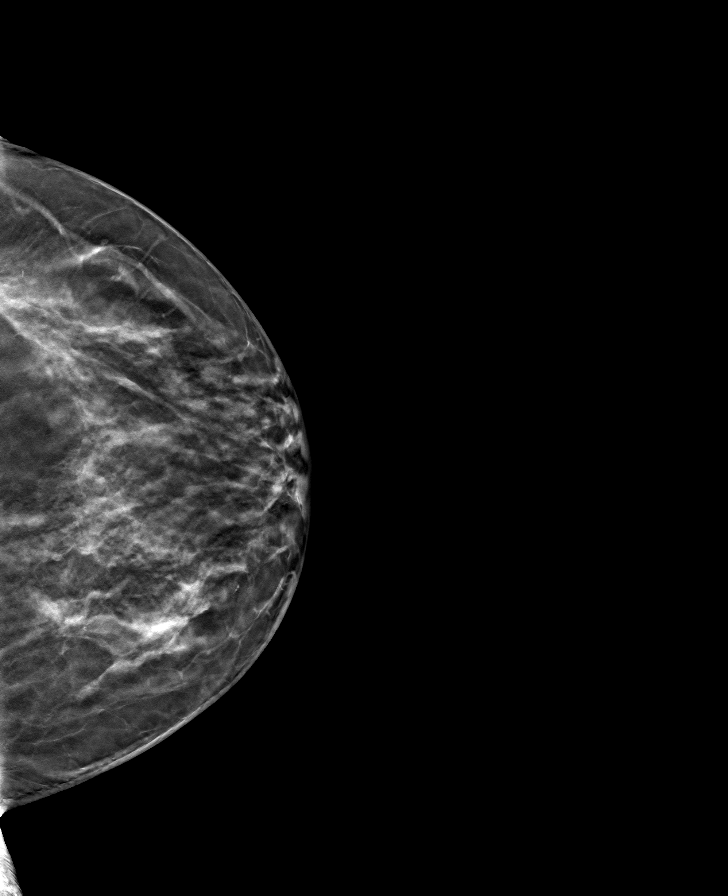

[L MLO tomo · tomo slice 40/79.0]
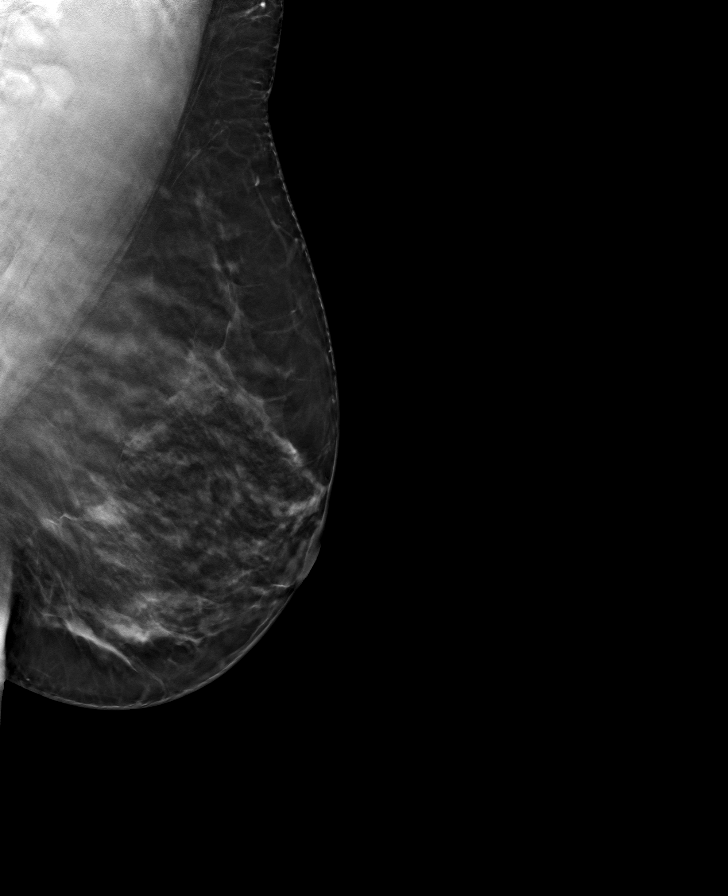

[8 of 24 positions shown; findings below may reference images not displayed]

ACR Breast Density Category c: The breast tissue is heterogeneously
dense, which may obscure small masses.
FINDINGS: There are no findings suspicious for malignancy. Images were
processed with CAD.
IMPRESSION: No mammographic evidence of malignancy. A result letter of this
screening mammogram will be mailed directly to the patient.

RECOMMENDATION:
Screening mammogram in one year. (Code:[id number])

BI-RADS CATEGORY  1: Negative.

## 2020-11-12 DIAGNOSIS — M79672 Pain in left foot: Secondary | ICD-10-CM | POA: Diagnosis not present

## 2020-11-12 DIAGNOSIS — M25472 Effusion, left ankle: Secondary | ICD-10-CM | POA: Diagnosis not present

## 2020-11-12 DIAGNOSIS — M25672 Stiffness of left ankle, not elsewhere classified: Secondary | ICD-10-CM | POA: Diagnosis not present

## 2020-11-15 DIAGNOSIS — M25672 Stiffness of left ankle, not elsewhere classified: Secondary | ICD-10-CM | POA: Diagnosis not present

## 2020-11-15 DIAGNOSIS — M25472 Effusion, left ankle: Secondary | ICD-10-CM | POA: Diagnosis not present

## 2020-11-15 DIAGNOSIS — M79672 Pain in left foot: Secondary | ICD-10-CM | POA: Diagnosis not present

## 2020-11-19 DIAGNOSIS — M79672 Pain in left foot: Secondary | ICD-10-CM | POA: Diagnosis not present

## 2020-11-19 DIAGNOSIS — M25472 Effusion, left ankle: Secondary | ICD-10-CM | POA: Diagnosis not present

## 2020-11-19 DIAGNOSIS — M25672 Stiffness of left ankle, not elsewhere classified: Secondary | ICD-10-CM | POA: Diagnosis not present

## 2020-11-25 ENCOUNTER — Other Ambulatory Visit: Payer: Self-pay | Admitting: Podiatry

## 2020-11-25 NOTE — Telephone Encounter (Signed)
Please advise 

## 2020-11-26 DIAGNOSIS — M25672 Stiffness of left ankle, not elsewhere classified: Secondary | ICD-10-CM | POA: Diagnosis not present

## 2020-11-26 DIAGNOSIS — M79672 Pain in left foot: Secondary | ICD-10-CM | POA: Diagnosis not present

## 2020-11-26 DIAGNOSIS — M25472 Effusion, left ankle: Secondary | ICD-10-CM | POA: Diagnosis not present

## 2020-12-03 ENCOUNTER — Encounter: Payer: BC Managed Care – PPO | Admitting: Podiatry

## 2020-12-05 ENCOUNTER — Ambulatory Visit (INDEPENDENT_AMBULATORY_CARE_PROVIDER_SITE_OTHER): Payer: BC Managed Care – PPO | Admitting: Podiatry

## 2020-12-05 ENCOUNTER — Other Ambulatory Visit: Payer: Self-pay

## 2020-12-05 ENCOUNTER — Encounter: Payer: Self-pay | Admitting: Podiatry

## 2020-12-05 DIAGNOSIS — M7662 Achilles tendinitis, left leg: Secondary | ICD-10-CM | POA: Diagnosis not present

## 2020-12-05 DIAGNOSIS — Z9889 Other specified postprocedural states: Secondary | ICD-10-CM | POA: Diagnosis not present

## 2020-12-05 DIAGNOSIS — M7732 Calcaneal spur, left foot: Secondary | ICD-10-CM

## 2020-12-10 NOTE — Progress Notes (Signed)
   Subjective:  Patient presents today status post retrocalcaneal exostectomy with repair of Achilles tendon left. DOS: 08/23/2020.  Patient states that she is doing well however she is having some intermittent pain to the surgical area.  She states that when she wear shoes it irritates the back of her heel.  She presents for further treatment evaluation  Past Medical History:  Diagnosis Date  . Hypertension       Objective/Physical Exam Neurovascular status intact.  Skin incision has healed completely.  Negative for any significant edema throughout the foot or ankle.  There is some very minimal pain on palpation throughout the posterior heel.  Muscle strength 5/5 all compartments  Assessment: 1. s/p retrocalcaneal exostectomy with repair of Achilles tendon left. DOS: 08/23/2020   Plan of Care:  1. Patient was evaluated.   2.  Continue daily stretching exercises.  Continue physical therapy  3.  Recommend OTC Voltaren gel topical daily  4.  Silicone heel sleeve was provided.  Perhaps this may cushion the heel and shoe gear  5.  Refrain from work an additional 6 weeks  6.  Return to clinic in 6 weeks   Felecia Shelling, DPM [facility]  Dr. Felecia Shelling, DPM    [address] [place], [place] [address]                Office [phone]  Fax [phone]

## 2020-12-18 DIAGNOSIS — Z1152 Encounter for screening for COVID-19: Secondary | ICD-10-CM | POA: Diagnosis not present

## 2020-12-27 ENCOUNTER — Telehealth: Payer: Self-pay | Admitting: Podiatry

## 2020-12-27 NOTE — Telephone Encounter (Signed)
Patient would like a Handicap placcard. Please advise

## 2021-01-16 ENCOUNTER — Other Ambulatory Visit: Payer: Self-pay

## 2021-01-16 ENCOUNTER — Ambulatory Visit (INDEPENDENT_AMBULATORY_CARE_PROVIDER_SITE_OTHER): Payer: BC Managed Care – PPO | Admitting: Podiatry

## 2021-01-16 ENCOUNTER — Encounter: Payer: Self-pay | Admitting: Podiatry

## 2021-01-16 DIAGNOSIS — M7732 Calcaneal spur, left foot: Secondary | ICD-10-CM

## 2021-01-16 DIAGNOSIS — M7662 Achilles tendinitis, left leg: Secondary | ICD-10-CM

## 2021-01-16 DIAGNOSIS — Z9889 Other specified postprocedural states: Secondary | ICD-10-CM

## 2021-01-21 ENCOUNTER — Encounter: Payer: BC Managed Care – PPO | Admitting: Podiatry

## 2021-01-28 DIAGNOSIS — R8290 Unspecified abnormal findings in urine: Secondary | ICD-10-CM | POA: Diagnosis not present

## 2021-01-28 DIAGNOSIS — E6609 Other obesity due to excess calories: Secondary | ICD-10-CM | POA: Diagnosis not present

## 2021-01-28 DIAGNOSIS — R413 Other amnesia: Secondary | ICD-10-CM | POA: Diagnosis not present

## 2021-01-28 DIAGNOSIS — I10 Essential (primary) hypertension: Secondary | ICD-10-CM | POA: Diagnosis not present

## 2021-01-30 NOTE — Progress Notes (Signed)
   Subjective:  Patient presents today status post retrocalcaneal exostectomy with repair of Achilles tendon left. DOS: 08/23/2020.  Patient states that she is doing well however she is having some intermittent pain to the surgical area.  She states that when she wear shoes it irritates the back of her heel.  She presents for further treatment evaluation  Past Medical History:  Diagnosis Date  . Hypertension       Objective/Physical Exam Neurovascular status intact.  Skin incision has healed completely.  Negative for any significant edema throughout the foot or ankle.  There is some very minimal pain on palpation throughout the posterior heel.  Muscle strength 5/5 all compartments  Assessment: 1. s/p retrocalcaneal exostectomy with repair of Achilles tendon left. DOS: 08/23/2020   Plan of Care:  1. Patient was evaluated.   2.  Patient may now resume full activity no restrictions. Three.  Continue OTC Voltaren gel daily as needed Four.  Note for work was provided.  Half shift only x2 weeks.  Rest as needed. Five.  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 Felecia Shelling, DPM    [address] [place], [place] [address]                Office [phone]  Fax [phone]

## 2021-04-01 ENCOUNTER — Ambulatory Visit: Payer: BC Managed Care – PPO | Admitting: Neurology

## 2021-04-09 ENCOUNTER — Encounter: Payer: Self-pay | Admitting: Neurology

## 2021-04-09 ENCOUNTER — Other Ambulatory Visit: Payer: Self-pay

## 2021-04-09 ENCOUNTER — Ambulatory Visit: Payer: BC Managed Care – PPO | Admitting: Neurology

## 2021-04-09 VITALS — BP 123/76 | HR 60 | Ht 66.0 in | Wt 222.0 lb

## 2021-04-09 DIAGNOSIS — R6889 Other general symptoms and signs: Secondary | ICD-10-CM | POA: Diagnosis not present

## 2021-04-09 DIAGNOSIS — R519 Headache, unspecified: Secondary | ICD-10-CM

## 2021-04-09 DIAGNOSIS — E669 Obesity, unspecified: Secondary | ICD-10-CM | POA: Diagnosis not present

## 2021-04-09 DIAGNOSIS — R0683 Snoring: Secondary | ICD-10-CM

## 2021-04-09 DIAGNOSIS — R351 Nocturia: Secondary | ICD-10-CM

## 2021-04-09 DIAGNOSIS — R413 Other amnesia: Secondary | ICD-10-CM

## 2021-04-09 DIAGNOSIS — G4719 Other hypersomnia: Secondary | ICD-10-CM | POA: Diagnosis not present
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
## 2021-04-09 NOTE — Patient Instructions (Signed)
You have complaints of memory loss: memory loss or changes in cognitive function can have many reasons and does not always mean you have dementia. Conditions that can contribute to subjective or objective memory loss include: depression, stress, poor sleep from insomnia or sleep apnea, dehydration, fluctuation in blood sugar values, thyroid or electrolyte dysfunction and certain vitamin deficiencies. Dementia can be caused by stroke, brain atherosclerosis or brain vascular disease due to vascular risk factors (smoking, high blood pressure, high cholesterol, obesity and uncontrolled diabetes), certain degenerative brain disorders (including Parkinson's disease and Multiple sclerosis) and by Alzheimer's disease or other, more rare and sometimes hereditary causes. We will do some additional testing:   We will do a brain scan, called MRI and call you with the test results. We will have to schedule you for this on a separate date. This test requires authorization from your insurance, and we will take care of the insurance process.  Based on your symptoms and your exam I believe you are at risk for obstructive sleep apnea (aka OSA), and I think we should proceed with a sleep study to determine whether you do or do not have OSA and how severe it is. Even, if you have mild OSA, I may want you to consider treatment with CPAP, as treatment of even borderline or mild sleep apnea can result and improvement of symptoms such as sleep disruption, daytime sleepiness, nighttime bathroom breaks, restless leg symptoms, improvement of headache syndromes, even improved mood disorder.   As explained, an attended sleep study meaning you get to stay overnight in the sleep lab, lets [place] monitor sleep-related behaviors such as sleep talking and leg movements in sleep, in addition to monitoring for sleep apnea.  A home sleep test is a screening tool for sleep apnea only, and unfortunately does not help with any other sleep-related  diagnoses.  Please remember, the long-term risks and ramifications of untreated moderate to severe obstructive sleep apnea are: increased Cardiovascular disease, including congestive heart failure, stroke, difficult to control hypertension, treatment resistant obesity, arrhythmias, especially irregular heartbeat commonly known as A. Fib. (atrial fibrillation); even type 2 diabetes has been linked to untreated OSA.   Sleep apnea can cause disruption of sleep and sleep deprivation in most cases, which, in turn, can cause recurrent headaches, problems with memory, mood, concentration, focus, and vigilance. Most people with untreated sleep apnea report excessive daytime sleepiness, which can affect their ability to drive. Please do not drive if you feel sleepy. Patients with sleep apnea can also develop difficulty initiating and maintaining sleep (aka insomnia).   Having sleep apnea may increase your risk for other sleep disorders, including involuntary behaviors sleep such as sleep terrors, sleep talking, sleepwalking.    Having sleep apnea can also increase your risk for restless leg syndrome and leg movements at night.   Please note that untreated obstructive sleep apnea may carry additional perioperative morbidity. Patients with significant obstructive sleep apnea (typically, in the moderate to severe degree) should receive, if possible, perioperative PAP (positive airway pressure) therapy and the surgeons and particularly the anesthesiologists should be informed of the diagnosis and the severity of the sleep disordered breathing.   I will likely see you back after your sleep study to go over the test results and where to go from there. We will call you after your sleep study to advise about the results (most likely, you will hear from [facility], my nurse) and to set up an appointment at the time, as necessary.  Our sleep lab administrative assistant will call you to schedule your sleep study and give  you further instructions, regarding the check in process for the sleep study, arrival time, what to bring, when you can expect to leave after the study, etc., and to answer any other logistical questions you may have. If you don't hear back from her by about 2 weeks from now, please feel free to call her direct line at [phone] or you can call our general clinic number, or email [place] through My Chart.

## 2021-04-16 ENCOUNTER — Telehealth: Payer: Self-pay | Admitting: Neurology

## 2021-04-16 NOTE — Telephone Encounter (Signed)
[facility] [place] and checked [place] requirements for [address] (MRI Brain w/wo contrast). No PA required. Reference #[id number]

## 2021-04-25 ENCOUNTER — Other Ambulatory Visit: Payer: BC Managed Care – PPO

## 2021-04-28 DIAGNOSIS — Z20822 Contact with and (suspected) exposure to covid-19: Secondary | ICD-10-CM | POA: Diagnosis not present

## 2021-04-30 DIAGNOSIS — J069 Acute upper respiratory infection, unspecified: Secondary | ICD-10-CM | POA: Diagnosis not present

## 2021-05-03 DIAGNOSIS — R059 Cough, unspecified: Secondary | ICD-10-CM | POA: Diagnosis not present

## 2021-05-03 DIAGNOSIS — Z20822 Contact with and (suspected) exposure to covid-19: Secondary | ICD-10-CM | POA: Diagnosis not present

## 2021-05-03 DIAGNOSIS — R062 Wheezing: Secondary | ICD-10-CM | POA: Diagnosis not present

## 2021-05-03 DIAGNOSIS — J988 Other specified respiratory disorders: Secondary | ICD-10-CM | POA: Diagnosis not present

## 2021-05-05 ENCOUNTER — Other Ambulatory Visit: Payer: BC Managed Care – PPO

## 2021-05-16 ENCOUNTER — Ambulatory Visit
Admission: RE | Admit: 2021-05-16 | Discharge: 2021-05-16 | Disposition: A | Discharge status: Home / Self Care | Payer: BC Managed Care – PPO | Source: Ambulatory Visit | Attending: Neurology | Admitting: Neurology

## 2021-05-16 ENCOUNTER — Other Ambulatory Visit: Payer: Self-pay

## 2021-05-16 DIAGNOSIS — E669 Obesity, unspecified: Secondary | ICD-10-CM

## 2021-05-16 DIAGNOSIS — R0683 Snoring: Secondary | ICD-10-CM

## 2021-05-16 DIAGNOSIS — G4719 Other hypersomnia: Secondary | ICD-10-CM | POA: Diagnosis not present

## 2021-05-16 DIAGNOSIS — R413 Other amnesia: Secondary | ICD-10-CM | POA: Diagnosis not present

## 2021-05-16 DIAGNOSIS — R6889 Other general symptoms and signs: Secondary | ICD-10-CM

## 2021-05-16 DIAGNOSIS — R519 Headache, unspecified: Secondary | ICD-10-CM

## 2021-05-16 DIAGNOSIS — R351 Nocturia: Secondary | ICD-10-CM

## 2021-05-16 MED ORDER — GADOBENATE DIMEGLUMINE 529 MG/ML IV SOLN
20.0000 mL | Freq: Once | INTRAVENOUS | Status: AC | PRN
  Administered 2021-05-16: 20 mL via INTRAVENOUS

## 2021-05-17 NOTE — Progress Notes (Signed)
Please call patient and advise her that her brain MRI with and without contrast from 05/16/2021 showed age-appropriate findings, no acute findings, no abnormal contrast uptake.  Overall reassuring.  We will proceed with a sleep study as discussed.

## 2021-05-20 ENCOUNTER — Telehealth: Payer: Self-pay

## 2021-05-20 ENCOUNTER — Ambulatory Visit (INDEPENDENT_AMBULATORY_CARE_PROVIDER_SITE_OTHER): Payer: BC Managed Care – PPO | Admitting: Neurology

## 2021-05-20 DIAGNOSIS — R0683 Snoring: Secondary | ICD-10-CM

## 2021-05-20 DIAGNOSIS — G4719 Other hypersomnia: Secondary | ICD-10-CM

## 2021-05-20 DIAGNOSIS — R519 Headache, unspecified: Secondary | ICD-10-CM

## 2021-05-20 DIAGNOSIS — E669 Obesity, unspecified: Secondary | ICD-10-CM

## 2021-05-20 DIAGNOSIS — G4733 Obstructive sleep apnea (adult) (pediatric): Secondary | ICD-10-CM | POA: Diagnosis not present

## 2021-05-20 DIAGNOSIS — R351 Nocturia: Secondary | ICD-10-CM

## 2021-05-20 DIAGNOSIS — R6889 Other general symptoms and signs: Secondary | ICD-10-CM

## 2021-05-20 DIAGNOSIS — R413 Other amnesia: Secondary | ICD-10-CM

## 2021-05-20 NOTE — Telephone Encounter (Signed)
I called pt and advised of MRI report, she verbalized understanding.  Pt will proceed with sleep study as scheduled for 05/20/21.  Pt advised we would call once results are finalized.

## 2021-05-20 NOTE — Telephone Encounter (Signed)
-----   Message from Huston Foley, MD sent at 05/17/2021 10:34 AM EDT ----- Please call patient and advise her that her brain MRI with and without contrast from 05/16/2021 showed age-appropriate findings, no acute findings, no abnormal contrast uptake.  Overall reassuring.  We will proceed with a sleep study as discussed.

## 2021-05-20 NOTE — Telephone Encounter (Signed)
LVM for pt to call me back to schedule sleep study  

## 2021-05-21 NOTE — Progress Notes (Signed)
See procedure note.

## 2021-05-23 ENCOUNTER — Telehealth: Payer: Self-pay

## 2021-05-23 DIAGNOSIS — R059 Cough, unspecified: Secondary | ICD-10-CM | POA: Diagnosis not present

## 2021-05-23 DIAGNOSIS — I10 Essential (primary) hypertension: Secondary | ICD-10-CM | POA: Diagnosis not present

## 2021-05-23 DIAGNOSIS — M62838 Other muscle spasm: Secondary | ICD-10-CM | POA: Diagnosis not present

## 2021-05-23 DIAGNOSIS — R062 Wheezing: Secondary | ICD-10-CM | POA: Diagnosis not present

## 2021-05-23 NOTE — Procedures (Signed)
[facility]  HOME SLEEP TEST (Watch PAT) REPORT  STUDY DATE: 05-20-21  DOB: 1961-01-12  MRN: [id number]  ORDERING CLINICIAN: Huston Foley, MD, PhD   REFERRING CLINICIAN: Ileana Ladd, MD   CLINICAL INFORMATION/HISTORY: 60 year old woman with a history of hypertension, lumbar degenerative disc disease, Achilles tendinitis on the left, varicose veins, and obesity, who reports forgetfulness. She reports snoring and excessive daytime somnolence.   Epworth sleepiness score: 18/24.  BMI: 35.8 kg/m  Neck Circumference: 16.125"  FINDINGS:   Sleep Summary:   Total Recording Time (hours, min): 6 h 25 min  Total Sleep Time (hours, min):  4 h 48 min   Percent REM (%):    15.96%   Respiratory Indices:   Calculated pAHI (per hour):  19.0/hour         REM pAHI:    53.5/hour       NREM pAHI: 12.0/hour  Supine AHI: 18.3 /hour  Oxygen Saturation Statistics:    Oxygen Saturation (%) Mean: 96%   Minimum oxygen saturation (%):  92%   O2 Saturation Range (%): 92-100%    O2 Saturation (minutes) <=88%: 0 min  Pulse Rate Statistics:   Pulse Mean (bpm):   63/min    Pulse Range (bpm) 46-93/min  IMPRESSION: OSA (obstructive sleep apnea)  RECOMMENDATION:  This home sleep test demonstrates moderate obstructive sleep apnea - by number of events only - with a total AHI of 19/hour and O2 nadir of 92%. Intermittent mild to moderate snoring was detected. Treatment with positive airway pressure is recommended, given her memory complaints and sleep related symptoms. The patient will be advised to proceed with an autoPAP titration/trial at home for now. A full night titration study may be considered to optimize treatment settings, if needed down the road. Please note that untreated obstructive sleep apnea may carry additional perioperative morbidity. Patients with significant obstructive sleep apnea should receive perioperative PAP therapy and the surgeons and particularly the  anesthesiologist should be informed of the diagnosis and the severity of the sleep disordered breathing. The patient should be cautioned not to drive, work at heights, or operate dangerous or heavy equipment when tired or sleepy. Review and reiteration of good sleep hygiene measures should be pursued with any patient. Other causes of the patient's symptoms, including circadian rhythm disturbances, an underlying mood disorder, medication effect and/or an underlying medical problem cannot be ruled out based on this test. Clinical correlation is recommended. The patient and her referring provider will be notified of the test results. The patient will be seen in follow up in sleep clinic at [facility].  I certify that I have reviewed the raw data recording prior to the issuance of this report in accordance with the standards of the American Academy of Sleep Medicine (AASM).  Huston Foley, MD, PhD [facility]) Diplomat, ABPN (Neurology and Sleep)     INTERPRETING PHYSICIAN:   Huston Foley, MD, PhD  Board Certified in Neurology and Sleep Medicine  [facility] [address], [address] [place], [place] [address] [phone]  Sleep Summary   Start Study Time: End Study Time: Total Recording Time:     10:49:06 PM 5:14:17 AM  6 h, 25 min  Total Sleep Time % REM of Sleep Time:  4 h, 48 min  16.0     Respiratory Indices     Total Events REM NREM All Night  pRDI: pAHI 3%: ODI 4%: pAHIc 3%: % CSR: pAHI 4%:  95  86  18  0 0.0 20 54.8 53.5 14.4 0.0 14.1 12.0 1.9 0.0 21.0 19.0 4.0 0.0 4.4     Oxygen Saturation Statistics   Mean: 96 Minimum: 92 Maximum: 100  Mean of Desaturations Nadirs (%):   94  Oxygen Desatur. %: 4-9 10-20 >20 Total  Events Number Total  18 100.0  0 0.0  0 0.0  18 100.0  Oxygen Saturation: <90 <=88 <85 <80 <70  Duration (minutes): Sleep % 0.0 0.0 0.0 0.0 0.0 0.0 0.0 0.0 0.0 0.0     Pulse Rate Statistics  during Sleep (BPM)     Mean: 63 Minimum: 46 Maximum: 93       Body Position Statistics  Position Supine Prone Right Left Non-Supine  Sleep (min) 173.2 0.0 11.5 103.5 115.0  Sleep % 60.1 0.0 4.0 35.9 39.9  pRDI 20.1 N/A 48.7 19.3 22.6  pAHI 3% 18.3 N/A 43.2 17.3 20.2  ODI 4% 3.5 N/A 16.2 3.3 4.8     Snoring Statistics Snoring Level (dB) >40 >50 >60 >70 >80 >Threshold (45)  Sleep (min) 27.4 3.3 1.1 0.0 0.0 6.2  Sleep % 9.5 1.1 0.4 0.0 0.0 2.2   Mean: 40 dB

## 2021-05-23 NOTE — Telephone Encounter (Signed)
-----   Message from Huston Foley, MD sent at 05/23/2021  7:21 AM EDT ----- Patient referred by Dr. Modesto Charon for memory loss, seen by me on 04/09/21, patient had a HST on 05/20/21.    Please call and notify the patient that the recent home sleep test showed obstructive sleep apnea in the moderate range (by number of events, AHI was 19/h, O2 nadir 92%). She endorsed and ESS of 18/24 at the time of her visit. I recommend treatment of her sleep apnea in the form of autoPAP (she may benefit with regards to her sleep Sx and her memory complaints). This means, that we don't have to bring her in for a sleep study with CPAP, but will let her start using a so called autoPAP machine at home, which is a CPAP-like machine with self-adjusting pressures. We will send the order to a local DME company (of her choice, or as per insurance requirement). The DME representative will fit her with a mask, educate her on how to use the machine, how to put the mask on, etc. I have placed an order in the chart. Please send the order, talk to patient, send report to referring MD. We will need a FU in sleep clinic for 10 weeks post-PAP set up, please arrange that with me or one of our NPs. Also reinforce the need for compliance with treatment. Thanks,   Huston Foley, MD, PhD [facility])

## 2021-05-23 NOTE — Addendum Note (Signed)
Addended by: Huston Foley on: 05/23/2021 07:21 AM   Modules accepted: Orders

## 2021-05-23 NOTE — Telephone Encounter (Signed)
I called pt and we discussed result of sleep study. Pt is agreeable to order being sent to [facility] but wanted to wait to know what the financial commitment would be before proceeding. Pt will call me back once she hears from [facility] and we will re-group.  Order has been sent to [facility].

## 2021-07-04 DIAGNOSIS — I10 Essential (primary) hypertension: Secondary | ICD-10-CM | POA: Diagnosis not present

## 2021-07-04 DIAGNOSIS — E6609 Other obesity due to excess calories: Secondary | ICD-10-CM | POA: Diagnosis not present

## 2021-07-04 DIAGNOSIS — G479 Sleep disorder, unspecified: Secondary | ICD-10-CM | POA: Diagnosis not present

## 2021-07-04 DIAGNOSIS — R7303 Prediabetes: Secondary | ICD-10-CM | POA: Diagnosis not present

## 2022-01-16 DIAGNOSIS — M546 Pain in thoracic spine: Secondary | ICD-10-CM | POA: Diagnosis not present

## 2022-01-23 NOTE — Telephone Encounter (Signed)
Noted, thank you

## 2022-01-23 NOTE — Telephone Encounter (Signed)
We received a message from [facility] stating they had to avoid the CPAP card due to no response from the patient.  I called the patient.  She states [facility] had called her but she did not know they voided the order.  I let her know that they are only able to call so many times and then they have to cancel the order.  I let her know we are happy to rewrite it should she want to proceed with CPAP.  She said she would discuss this with her primary care Dr. Modesto Charon next month.  Her questions were answered.  She is aware of the potential adverse effects of not treating sleep apnea which includes but not limited to memory trouble, headaches, stroke, heart attack.  I asked her to give [place] a call with her decision after she sees her primary care.  Also we could check for alternative treatment suggestions if she decides she does not want to do CPAP.  Patient is aware she has moderate sleep apnea and treating her sleep apnea with CPAP may also help her feel not as tired during the day.  She thanked me for the call.  Results forwarded to Dr Modesto Charon.

## 2022-02-14 DIAGNOSIS — R7303 Prediabetes: Secondary | ICD-10-CM | POA: Diagnosis not present

## 2022-02-14 DIAGNOSIS — G479 Sleep disorder, unspecified: Secondary | ICD-10-CM | POA: Diagnosis not present

## 2022-02-14 DIAGNOSIS — I10 Essential (primary) hypertension: Secondary | ICD-10-CM | POA: Diagnosis not present

## 2022-02-14 DIAGNOSIS — E6609 Other obesity due to excess calories: Secondary | ICD-10-CM | POA: Diagnosis not present

## 2022-02-18 DIAGNOSIS — I10 Essential (primary) hypertension: Secondary | ICD-10-CM | POA: Diagnosis not present

## 2022-02-18 DIAGNOSIS — R7303 Prediabetes: Secondary | ICD-10-CM | POA: Diagnosis not present

## 2022-02-18 DIAGNOSIS — Z130 Encounter for screening for diseases of the blood and blood-forming organs and certain disorders involving the immune mechanism: Secondary | ICD-10-CM | POA: Diagnosis not present

## 2022-02-28 DIAGNOSIS — I10 Essential (primary) hypertension: Secondary | ICD-10-CM | POA: Diagnosis not present

## 2022-02-28 DIAGNOSIS — T754XXA Electrocution, initial encounter: Secondary | ICD-10-CM | POA: Diagnosis not present

## 2022-02-28 DIAGNOSIS — R519 Headache, unspecified: Secondary | ICD-10-CM | POA: Diagnosis not present

## 2022-02-28 DIAGNOSIS — Z6836 Body mass index (BMI) 36.0-36.9, adult: Secondary | ICD-10-CM | POA: Diagnosis not present

## 2022-03-04 ENCOUNTER — Telehealth: Payer: Self-pay

## 2022-03-04 NOTE — Telephone Encounter (Signed)
NOTES SCANNED TO REFERRAL 

## 2022-03-05 ENCOUNTER — Encounter: Payer: Self-pay | Admitting: Cardiology

## 2022-03-05 ENCOUNTER — Ambulatory Visit (INDEPENDENT_AMBULATORY_CARE_PROVIDER_SITE_OTHER): Payer: BC Managed Care – PPO | Admitting: Cardiology

## 2022-03-05 DIAGNOSIS — R072 Precordial pain: Secondary | ICD-10-CM

## 2022-03-05 DIAGNOSIS — I10 Essential (primary) hypertension: Secondary | ICD-10-CM

## 2022-03-05 MED ORDER — METOPROLOL TARTRATE 50 MG PO TABS: ORAL_TABLET | ORAL | 0 refills | Status: AC

## 2022-03-05 NOTE — Assessment & Plan Note (Signed)
Chest discomfort, left arm discomfort most notable after working on a machine at work with an [employer] shock that occurred.  EKG no ischemic changes.  Blood pressure under fairly good control with valsartan.  We will go ahead and check a coronary CT scan to ensure proper coronary blood flow.  Address coronary plaque if necessary. ?

## 2022-03-05 NOTE — Patient Instructions (Addendum)
Medication Instructions:  ?Your physician recommends that you continue on your current medications as directed. Please refer to the Current Medication list given to you today. ? ?*If you need a refill on your cardiac medications before your next appointment, please call your pharmacy* ? ?Lab Work: ?If you have labs (blood work) drawn today and your tests are completely normal, you will receive your results only by: ?MyChart Message (if you have MyChart) OR ?A paper copy in the mail ?If you have any lab test that is abnormal or we need to change your treatment, we will call you to review the results. ? ?Testing/Procedures: ?Your physician has requested that you have cardiac CT. Cardiac computed tomography (CT) is a painless test that uses an x-ray machine to take clear, detailed pictures of your heart. For further information please visit [email or web address]. Please follow instruction sheet as given. ? ?Follow-Up: ?At [facility], you and your health needs are our priority.  As part of our continuing mission to provide you with exceptional heart care, we have created designated Provider Care Teams.  These Care Teams include your primary Cardiologist (physician) and Advanced Practice Providers (APPs -  Physician Assistants and Nurse Practitioners) who all work together to provide you with the care you need, when you need it. ? ?We recommend signing up for the patient portal called "MyChart".  Sign up information is provided on this After Visit Summary.  MyChart is used to connect with patients for Virtual Visits (Telemedicine).  Patients are able to view lab/test results, encounter notes, upcoming appointments, etc.  Non-urgent messages can be sent to your provider as well.   ?To learn more about what you can do with MyChart, go to [email or web address].   ? ?Your next appointment:   ?As needed ? ?The format for your next appointment:   ?In Person ? ?Provider:   ?Dr. Marlou Porch ? ?Other Instructions ? ? ?Your cardiac  CT will be scheduled at one of the below locations:  ? ?[facility] ?[address] ?[place], [place] [address] ?[phone] ? ? ?If scheduled at [facility], please arrive at the [facility] and Children's Entrance (Entrance C2) of [facility] 30 minutes prior to test start time. ?You can use the FREE valet parking offered at entrance C (encouraged to control the heart rate for the test)  ?Proceed to the [facility] Radiology Department (first floor) to check-in and test prep. ? ?All radiology patients and guests should use entrance C2 at [facility], accessed from [facility], even though the hospital's physical address listed is [address]. ? ? ? ? ?Please follow these instructions carefully (unless otherwise directed): ? ?On the Night Before the Test: ?Be sure to Drink plenty of water. ?Do not consume any caffeinated/decaffeinated beverages or chocolate 12 hours prior to your test. ?Do not take any antihistamines 12 hours prior to your test. ? ?On the Day of the Test: ?Drink plenty of water until 1 hour prior to the test. ?Do not eat any food 4 hours prior to the test. ?You may take your regular medications prior to the test.  ?Take metoprolol (Lopressor) 50 mg  two hours prior to test. ? ?After the Test: ?Drink plenty of water. ?After receiving IV contrast, you may experience a mild flushed feeling. This is normal. ?On occasion, you may experience a mild rash up to 24 hours after the test. This is not dangerous. If this occurs, you can take Benadryl 25 mg and  increase your fluid intake. ?If you experience trouble breathing, this can be serious. If it is severe call 911 IMMEDIATELY. If it is mild, please call our office. ? ?We will call to schedule your test 2-4 weeks out understanding that some insurance companies will need an authorization prior to the service being performed.  ? ?For non-scheduling related questions, please contact the cardiac imaging  nurse navigator should you have any questions/concerns: ?Marchia Bond, Cardiac Imaging Nurse Navigator ?Gordy Clement, Cardiac Imaging Nurse Navigator ?[facility] ?Direct Office Dial: [phone]  ? ?For scheduling needs, including cancellations and rescheduling, please call [place], [phone].  ?

## 2022-03-05 NOTE — Progress Notes (Signed)
?Cardiology Office Note:   ? ?Date:  03/05/2022  ? ?ID:  Pamela Miles, DOB [birth year], MRN [id number] ? ?PCP:  Ileana Ladd, MD  ?Cardiologist:  Donato Schultz, MD   ? ?Referring MD: Ileana Ladd, MD  ? ? ? ?History of Present Illness:   ? ?Pamela Miles is a 61 y.o. female with a hx of hypertension, OSA, and COVID-infection here today for chest tightness at the request of Dr. Modesto Charon.  ? ?She saw Dr. Modesto Charon 02/28/22. She was checking the NNG machine at work when she was suddenly shocked. Since 3/22, she reported chest tightness, headache, and L arm tingling and soreness. She was referred to cardiology for further evaluation and possible cardiac stress test.  ? ?Today, she reports she was at work and working on a machine. She works at [employer] in [place]. She moved one of the parts when she saw and heard sparks. She was shocked and felt the tingling along her L arm. The shock was so strong that it caused her to jump back. Later that evening, she felt her L chest tighten with worsening headache, L arm soreness, and L 4th and 5th finger tingling. Her whole body was shaking and she felt imbalanced. She also noticed she needed to use the bathroom frequently. She believes the shock may have travelled up her R leg.  ? ?She reports being congested and being given muscle spasm medicine after COVID infection. She does not smoke or drink alcohol. She has no known history of cardiovascular disease in her family. She denies any palpitations, shortness of breath, lightheadedness, syncope, orthopnea, PND, lower extremity edema or exertional symptoms. ? ?Past Medical History:  ?Diagnosis Date  ? Chest tightness   ? COVID   ? DDD (degenerative disc disease), lumbar   ? Excessive daytime sleepiness   ? Facet arthropathy   ? Hypertension   ? Memory impairment   ? Muscle spasm   ? OSA (obstructive sleep apnea)   ? Sleep disorder   ? Varicose veins of bilateral lower extremities with pain   ? Wheezing   ? ? ?History  reviewed. No pertinent surgical history. ? ?Current Medications: ?Current Meds  ?Medication Sig  ? ALPRAZolam (XANAX) 0.5 MG tablet Take 0.5 mg by mouth 4 (four) times daily as needed.  ? metoprolol tartrate (LOPRESSOR) 50 MG tablet Take one tablet by mouth 2 hours prior to your CT scan  ? valsartan (DIOVAN) 80 MG tablet Take 80 mg by mouth daily.  ? verapamil (CALAN-SR) 240 MG CR tablet Take 240 mg by mouth at bedtime.  ? [DISCONTINUED] lisinopril-hydrochlorothiazide (PRINZIDE,ZESTORETIC) 20-12.5 MG tablet Take 1 tablet by mouth daily.  ?  ? ?Allergies:   Patient has no known allergies.  ? ?Social History  ? ?Socioeconomic History  ? Marital status: Married  ?  Spouse name: Not on file  ? Number of children: Not on file  ? Years of education: Not on file  ? Highest education level: Not on file  ?Occupational History  ? Not on file  ?Tobacco Use  ? Smoking status: Never  ? Smokeless tobacco: Never  ?Substance and Sexual Activity  ? Alcohol use: Yes  ? Drug use: No  ? Sexual activity: Not on file  ?Other Topics Concern  ? Not on file  ?Social History Narrative  ? Not on file  ? ?Social Determinants of Health  ? ?Financial Resource Strain: Not on file  ?Food Insecurity: Not on file  ?Transportation Needs: Not on  file  ?Physical Activity: Not on file  ?Stress: Not on file  ?Social Connections: Not on file  ?  ? ?Family History: ?The patient's family history includes Hypertension in her mother. There is no history of Sudden death, Diabetes, Heart attack, or Hyperlipidemia. ? ?ROS:   ?Please see the history of present illness.    ?(+) L arm tingling/soreness ?(+) L chest pain/tightness ?(+) Headache ?(+) L 4th and 5th finger tingling ?(+) Full body tremors ?(+) Imbalance ?(+) Urinary frequency ?All other systems reviewed and negative.  ? ?EKGs/Labs/Other Studies Reviewed:   ? ?The following studies were reviewed today: ?No prior cardiovascular studies ? ?EKG:  EKG was personally reviewed ?03/05/22: Sinus rhythm, rate 64  bpm ? ?Recent Labs: ?No results found for requested labs within last 8760 hours.  ? ?Recent Lipid Panel ?No results found for: CHOL, TRIG, HDL, CHOLHDL, VLDL, LDLCALC, LDLDIRECT ? ?CHA2DS2-VASc Score =   [ ] .  Therefore, the patient's annual risk of stroke is   %.    ?  ? ? ?Physical Exam:   ? ?VS:  BP (!) 142/78   Pulse 64   Ht 5\' 6"  (1.676 m)   Wt 221 lb 6.4 oz (100.4 kg)   LMP 03/27/2012   SpO2 96%   BMI 35.73 kg/m?    ? ?Wt Readings from Last 3 Encounters:  ?03/05/22 221 lb 6.4 oz (100.4 kg)  ?04/09/21 222 lb (100.7 kg)  ?06/09/19 230 lb (104.3 kg)  ?  ? ?GEN: Well nourished, well developed in no acute distress ?HEENT: Normal ?NECK: No JVD; No carotid bruits ?LYMPHATICS: No lymphadenopathy ?CARDIAC: RRR, no murmurs, rubs, gallops ?RESPIRATORY:  Clear to auscultation without rales, wheezing or rhonchi  ?ABDOMEN: Soft, non-tender, non-distended ?MUSCULOSKELETAL:  No edema; No deformity  ?SKIN: Warm and dry ?NEUROLOGIC:  Alert and oriented x 3 ?PSYCHIATRIC:  Normal affect  ? ?ASSESSMENT:   ? ?1. Precordial chest pain   ?2. Essential hypertension   ? ?PLAN:   ? ?Precordial chest pain ?Chest discomfort, left arm discomfort most notable after working on a machine at work with an 06/09/21 shock that occurred.  EKG no ischemic changes.  Blood pressure under fairly good control with valsartan.  We will go ahead and check a coronary CT scan to ensure proper coronary blood flow.  Address coronary plaque if necessary. ? ?Essential hypertension ?Continue with valsartan 80 mg.  No changes made to medication management.  Dosage reviewed. ? ? ?  ? ?Medication Adjustments/Labs and Tests Ordered: ?Current medicines are reviewed at length with the patient today.  Concerns regarding medicines are outlined above.  ?Orders Placed This Encounter  ?Procedures  ? CT CORONARY MORPH W/CTA COR W/SCORE W/CA W/CM &/OR WO/CM  ? EKG 12-Lead  ? ?Meds ordered this encounter  ?Medications  ? metoprolol tartrate (LOPRESSOR) 50 MG tablet  ?   Sig: Take one tablet by mouth 2 hours prior to your CT scan  ?  Dispense:  1 tablet  ?  Refill:  0  ? ?I,Mykaella Javier,acting as a scribe for 08/10/19, MD.,have documented all relevant documentation on the behalf of [employer], MD,as directed by  [employer], MD while in the presence of Donato Schultz, MD. ? ?I, Donato Schultz, MD, have reviewed all documentation for this visit. The documentation on 03/05/22 for the exam, diagnosis, procedures, and orders are all accurate and complete.  ? ?Signed, ?Donato Schultz, MD  ?03/05/2022 2:09 PM    ? [facility] ? ?

## 2022-03-05 NOTE — Assessment & Plan Note (Signed)
Continue with valsartan 80 mg.  No changes made to medication management.  Dosage reviewed. ?

## 2022-03-14 ENCOUNTER — Other Ambulatory Visit (HOSPITAL_COMMUNITY): Payer: Self-pay | Admitting: Emergency Medicine

## 2022-03-17 ENCOUNTER — Telehealth (HOSPITAL_COMMUNITY): Payer: Self-pay | Admitting: Emergency Medicine

## 2022-03-17 NOTE — Telephone Encounter (Signed)
Reaching out to patient to offer assistance regarding upcoming cardiac imaging study; pt verbalizes understanding of appt date/time, parking situation and where to check in, pre-test NPO status and medications ordered, and verified current allergies; name and call back number provided for further questions should they arise ?Marchia Bond RN Navigator Cardiac Imaging ?[facility] ?[phone] office ?[phone] cell ? ?Reminded to get metop from pharm ?Arrival 1130 ?"Running veins" ?claustro ?

## 2022-03-18 ENCOUNTER — Ambulatory Visit (HOSPITAL_COMMUNITY)
Admission: RE | Admit: 2022-03-18 | Discharge: 2022-03-18 | Disposition: A | Discharge status: Home / Self Care | Payer: No Typology Code available for payment source | Source: Ambulatory Visit | Attending: Cardiology | Admitting: Cardiology

## 2022-03-18 ENCOUNTER — Other Ambulatory Visit (HOSPITAL_COMMUNITY): Payer: Self-pay | Admitting: Cardiology

## 2022-03-18 DIAGNOSIS — Z789 Other specified health status: Secondary | ICD-10-CM | POA: Insufficient documentation

## 2022-03-18 DIAGNOSIS — R072 Precordial pain: Secondary | ICD-10-CM | POA: Insufficient documentation

## 2022-03-18 DIAGNOSIS — I10 Essential (primary) hypertension: Secondary | ICD-10-CM | POA: Insufficient documentation

## 2022-03-18 MED ORDER — IOHEXOL 350 MG/ML SOLN
100.0000 mL | Freq: Once | INTRAVENOUS | Status: AC | PRN
  Administered 2022-03-18: 100 mL via INTRAVENOUS

## 2022-03-18 MED ORDER — NITROGLYCERIN 0.4 MG SL SUBL
SUBLINGUAL_TABLET | SUBLINGUAL | Status: AC
  Filled 2022-03-18: qty 2

## 2022-03-18 MED ORDER — NITROGLYCERIN 0.4 MG SL SUBL: 0.8000 mg | SUBLINGUAL_TABLET | Freq: Once | SUBLINGUAL | Status: DC

## 2022-03-18 NOTE — Progress Notes (Signed)
CT scan completed. Tolerated well. D/C home ambulatory, awake and alert. In no distress. 

## 2022-04-28 ENCOUNTER — Emergency Department (HOSPITAL_COMMUNITY)
Admission: EM | Admit: 2022-04-28 | Discharge: 2022-04-28 | Disposition: A | Discharge status: Home / Self Care | Payer: BC Managed Care – PPO | Attending: Emergency Medicine | Admitting: Emergency Medicine

## 2022-04-28 ENCOUNTER — Encounter (HOSPITAL_COMMUNITY): Payer: Self-pay | Admitting: Emergency Medicine

## 2022-04-28 ENCOUNTER — Other Ambulatory Visit: Payer: Self-pay

## 2022-04-28 DIAGNOSIS — I83891 Varicose veins of right lower extremities with other complications: Secondary | ICD-10-CM | POA: Diagnosis not present

## 2022-04-28 DIAGNOSIS — I10 Essential (primary) hypertension: Secondary | ICD-10-CM | POA: Diagnosis not present

## 2022-04-28 DIAGNOSIS — Z79899 Other long term (current) drug therapy: Secondary | ICD-10-CM | POA: Insufficient documentation

## 2022-04-28 NOTE — ED Provider Notes (Signed)
[facility] EMERGENCY DEPARTMENT Provider Note   CSN: [id number] Arrival date & time: 04/28/22  1005     History  Chief Complaint  Patient presents with   Leg Injury    Varicose vein bleeding    Pamela Miles is a 61 y.o. female.  Patient is a 61 year old female with a history of hypertension, OSA, prior varicose veins status post surgery who is presenting today due to bleeding from the medial portion of her right lower leg.  Patient reports for some time now she has noticed a lesion on that portion of her leg and she thinks today after she got out of the shower the towel hit it and pulled the scab off and it started bleeding profusely.  She did apply pressure but it would not stop bleeding so she came to the ER for further care.  She does not take any anticoagulation and has not had this problem in the past.  The history is provided by the patient.      Home Medications Prior to Admission medications   Medication Sig Start Date End Date Taking? Authorizing Provider  ALPRAZolam Prudy Feeler) 0.5 MG tablet Take 0.5 mg by mouth 4 (four) times daily as needed. 05/09/20   [provider]  metoprolol tartrate (LOPRESSOR) 50 MG tablet Take one tablet by mouth 2 hours prior to your CT scan 03/05/22   Jake Bathe, MD  valsartan (DIOVAN) 80 MG tablet Take 80 mg by mouth daily. 12/14/21   [provider]  verapamil (CALAN-SR) 240 MG CR tablet Take 240 mg by mouth at bedtime.    [provider]      Allergies    Patient has no known allergies.    Review of Systems   Review of Systems  Physical Exam Updated Vital Signs BP 119/73 (BP Location: Right Arm)   Pulse 71   Temp 98.2 F (36.8 C) (Oral)   Resp 16   LMP 03/27/2012   SpO2 97%  Physical Exam Vitals and nursing note reviewed.  Cardiovascular:     Rate and Rhythm: Normal rate.  Pulmonary:     Effort: Pulmonary effort is normal.  Musculoskeletal:       Legs:  Skin:     General: Skin is warm and dry.  Neurological:     Mental Status: She is alert. Mental status is at baseline.  Psychiatric:        Mood and Affect: Mood normal.        Behavior: Behavior normal.    ED Results / Procedures / Treatments   Labs (all labs ordered are listed, but only abnormal results are displayed) Labs Reviewed - No data to display  EKG None  Radiology No results found.  Procedures Procedures    Medications Ordered in ED Medications - No data to display  ED Course/ Medical Decision Making/ A&P                           Medical Decision Making  Patient presenting today after a bleeding varicose vein.  Sounds like there has been a lesion on her leg for a while and a scab was most likely disrupted which caused the bleeding today.  Patient does not take any anticoagulation and after direct pressure for 2 minutes there is been no further bleeding.  A firm fitting bandage was placed and patient was ambulated without any recurrent bleeding identified.  Recommend that patient follow-up  with her vein specialist as the lesion may need to be removed to prevent bleeding in the future.  No indication for further testing or admission today.  Patient discharged home in good condition.        Final Clinical Impression(s) / ED Diagnoses Final diagnoses:  Bleeding from varicose veins of lower extremity, right    Rx / DC Orders ED Discharge Orders     None         Gwyneth Sprout, MD 04/28/22 1142

## 2022-04-28 NOTE — ED Triage Notes (Signed)
Patient coming from home, complaint of varicose vein bleeding. States she had it worked on 2 years ago, today taking a shower she scratched her leg and it started bleeding. Bleeding controlled at triage, VSS, NAD.

## 2022-04-28 NOTE — Discharge Instructions (Signed)
Leave the bandage in place the rest of the day and then you can remove it tomorrow.  You may want to cover it with a Band-Aid and wear your compression socks.

## 2022-04-30 DIAGNOSIS — I8312 Varicose veins of left lower extremity with inflammation: Secondary | ICD-10-CM | POA: Diagnosis not present

## 2022-04-30 DIAGNOSIS — I8311 Varicose veins of right lower extremity with inflammation: Secondary | ICD-10-CM | POA: Diagnosis not present

## 2022-05-01 ENCOUNTER — Ambulatory Visit (INDEPENDENT_AMBULATORY_CARE_PROVIDER_SITE_OTHER): Payer: BC Managed Care – PPO | Admitting: Physician Assistant

## 2022-05-01 ENCOUNTER — Encounter: Payer: Self-pay | Admitting: Physician Assistant

## 2022-05-01 VITALS — BP 120/68 | HR 86 | Ht 66.0 in | Wt 230.8 lb

## 2022-05-01 DIAGNOSIS — I10 Essential (primary) hypertension: Secondary | ICD-10-CM | POA: Diagnosis not present

## 2022-05-01 DIAGNOSIS — R072 Precordial pain: Secondary | ICD-10-CM | POA: Diagnosis not present

## 2022-05-01 NOTE — Patient Instructions (Signed)
Medication Instructions:  Your physician recommends that you continue on your current medications as directed. Please refer to the Current Medication list given to you today.  *If you need a refill on your cardiac medications before your next appointment, please call your pharmacy*   Lab Work: None ordered If you have labs (blood work) drawn today and your tests are completely normal, you will receive your results only by: MyChart Message (if you have MyChart) OR A paper copy in the mail If you have any lab test that is abnormal or we need to change your treatment, we will call you to review the results.   Testing/Procedures: None Ordered   Follow-Up: At [facility], you and your health needs are our priority.  As part of our continuing mission to provide you with exceptional heart care, we have created designated Provider Care Teams.  These Care Teams include your primary Cardiologist (physician) and Advanced Practice Providers (APPs -  Physician Assistants and Nurse Practitioners) who all work together to provide you with the care you need, when you need it.  We recommend signing up for the patient portal called "MyChart".  Sign up information is provided on this After Visit Summary.  MyChart is used to connect with patients for Virtual Visits (Telemedicine).  Patients are able to view lab/test results, encounter notes, upcoming appointments, etc.  Non-urgent messages can be sent to your provider as well.   To learn more about what you can do with MyChart, go to [email or web address].    Your next appointment:   AS NEEDED      The format for your next appointment:   In Person  Provider:   Donato Schultz, MD     Other Instructions   Important Information About Sugar
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
## 2022-05-14 DIAGNOSIS — I8311 Varicose veins of right lower extremity with inflammation: Secondary | ICD-10-CM | POA: Diagnosis not present

## 2022-05-14 DIAGNOSIS — R58 Hemorrhage, not elsewhere classified: Secondary | ICD-10-CM | POA: Diagnosis not present

## 2022-05-23 DIAGNOSIS — I83202 Varicose veins of unspecified lower extremity with both ulcer of calf and inflammation: Secondary | ICD-10-CM | POA: Diagnosis not present

## 2022-05-23 DIAGNOSIS — L97211 Non-pressure chronic ulcer of right calf limited to breakdown of skin: Secondary | ICD-10-CM | POA: Diagnosis not present

## 2022-06-11 DIAGNOSIS — I8311 Varicose veins of right lower extremity with inflammation: Secondary | ICD-10-CM | POA: Diagnosis not present

## 2022-06-16 DIAGNOSIS — I8311 Varicose veins of right lower extremity with inflammation: Secondary | ICD-10-CM | POA: Diagnosis not present

## 2022-06-27 DIAGNOSIS — N3281 Overactive bladder: Secondary | ICD-10-CM | POA: Diagnosis not present

## 2022-06-27 DIAGNOSIS — R350 Frequency of micturition: Secondary | ICD-10-CM | POA: Diagnosis not present

## 2022-06-27 DIAGNOSIS — R351 Nocturia: Secondary | ICD-10-CM | POA: Diagnosis not present

## 2022-07-16 DIAGNOSIS — I8311 Varicose veins of right lower extremity with inflammation: Secondary | ICD-10-CM | POA: Diagnosis not present

## 2022-07-22 DIAGNOSIS — I8311 Varicose veins of right lower extremity with inflammation: Secondary | ICD-10-CM | POA: Diagnosis not present

## 2022-08-08 DIAGNOSIS — I10 Essential (primary) hypertension: Secondary | ICD-10-CM | POA: Diagnosis not present

## 2022-08-08 DIAGNOSIS — Z124 Encounter for screening for malignant neoplasm of cervix: Secondary | ICD-10-CM | POA: Diagnosis not present

## 2022-08-08 DIAGNOSIS — Z0000 Encounter for general adult medical examination without abnormal findings: Secondary | ICD-10-CM | POA: Diagnosis not present

## 2022-11-07 ENCOUNTER — Emergency Department (HOSPITAL_BASED_OUTPATIENT_CLINIC_OR_DEPARTMENT_OTHER): Payer: BC Managed Care – PPO | Admitting: Radiology

## 2022-11-07 ENCOUNTER — Emergency Department (HOSPITAL_BASED_OUTPATIENT_CLINIC_OR_DEPARTMENT_OTHER)
Admission: EM | Admit: 2022-11-07 | Discharge: 2022-11-07 | Disposition: A | Discharge status: Home / Self Care | Payer: BC Managed Care – PPO | Attending: Emergency Medicine | Admitting: Emergency Medicine

## 2022-11-07 ENCOUNTER — Other Ambulatory Visit: Payer: Self-pay

## 2022-11-07 DIAGNOSIS — M25512 Pain in left shoulder: Secondary | ICD-10-CM | POA: Insufficient documentation

## 2022-11-07 DIAGNOSIS — G8929 Other chronic pain: Secondary | ICD-10-CM | POA: Insufficient documentation

## 2022-11-07 MED ORDER — KETOROLAC TROMETHAMINE 15 MG/ML IJ SOLN
15.0000 mg | Freq: Once | INTRAMUSCULAR | Status: AC
  Administered 2022-11-07: 15 mg via INTRAMUSCULAR
  Filled 2022-11-07: qty 1

## 2022-11-07 NOTE — ED Provider Notes (Signed)
[facility] EMERGENCY DEPT Provider Note   CSN: [id number] Arrival date & time: 11/07/22  1339     History  Chief Complaint  Patient presents with   Shoulder Pain    Pamela Miles is a 61 y.o. female.  Patient presents to the emergency meant for evaluation of ongoing left shoulder pain.  This has been chronic, but worsened over the past month.  Patient has received 2 injections for subacromial bursitis.  The first injection helped, however the last 1 did not.  She denies any new falls or injuries.  She states that her arm aches when she lifts it to shoulder height.  She has not yet returned to work.  She states that she was unable to see her current orthopedist that her job sent her to until December 27.  She was unable to get an appointment with a primary care doctor or even an urgent care, so she came to the emergency department today.  She has been taking tramadol without improvement.  No weakness, numbness or tingling the extremity.  She states that the pain radiates to her left elbow at times.       Home Medications Prior to Admission medications   Medication Sig Start Date End Date Taking? Authorizing Provider  ALPRAZolam Prudy Feeler) 0.5 MG tablet Take 0.5 mg by mouth 4 (four) times daily as needed. 05/09/20   [provider]  metoprolol tartrate (LOPRESSOR) 50 MG tablet Take one tablet by mouth 2 hours prior to your CT scan Patient not taking: Reported on 05/01/2022 03/05/22   Jake Bathe, MD  valsartan (DIOVAN) 80 MG tablet Take 80 mg by mouth daily. 12/14/21   [provider]  verapamil (CALAN-SR) 240 MG CR tablet Take 240 mg by mouth at bedtime.    [provider]      Allergies    Patient has no known allergies.    Review of Systems   Review of Systems  Physical Exam Updated Vital Signs BP (!) 144/80 (BP Location: Right Arm)   Pulse 67   Temp 97.8 F (36.6 C)   Resp 18   Ht 5\' 6"  (1.676 m)   Wt 101.2 kg   LMP  03/27/2012   SpO2 98%   BMI 35.99 kg/m  Physical Exam Vitals and nursing note reviewed.  Constitutional:      Appearance: She is well-developed.  HENT:     Head: Normocephalic and atraumatic.  Eyes:     Pupils: Pupils are equal, round, and reactive to light.  Cardiovascular:     Pulses: Normal pulses. No decreased pulses.  Musculoskeletal:        General: Tenderness present.     Right shoulder: No tenderness.     Left shoulder: Tenderness (Anterior and laterally) present. No swelling. Decreased range of motion.     Left upper arm: No edema.     Left elbow: No swelling. Normal range of motion.     Cervical back: Normal range of motion and neck supple.  Skin:    General: Skin is warm and dry.  Neurological:     Mental Status: She is alert.     Sensory: No sensory deficit.     Comments: Motor, sensation, and vascular distal to the injury is fully intact.   Psychiatric:        Mood and Affect: Mood normal.     ED Results / Procedures / Treatments   Labs (all labs ordered are listed, but only abnormal results  are displayed) Labs Reviewed - No data to display  EKG None  Radiology DG Shoulder Left  Result Date: 11/07/2022 CLINICAL DATA:  Chronic left shoulder pain after injury months ago. EXAM: LEFT SHOULDER - 2+ VIEW COMPARISON:  June 09, 2014. FINDINGS: There is no evidence of fracture or dislocation. Mild degenerative changes seen involving the left acromioclavicular joint. Crescent-shaped calcification is seen over greater tuberosity suggesting calcific tendinosis. Soft tissues are unremarkable. IMPRESSION: No fracture or dislocation is noted. 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 Electronically Signed   By: Lupita Raider M.D.   On: 11/07/2022 14:55    Procedures Procedures    Medications Ordered in ED Medications  ketorolac (TORADOL) 15 MG/ML injection 15 mg (has no administration in time range)    ED  Course/ Medical Decision Making/ A&P    Patient seen and examined. History obtained directly from patient.  Reviewed previous external orthopedic notes, most recently from 3 to 4 weeks ago.  Labs/EKG: None ordered  Imaging: Ordered x-ray of left shoulder, discussed with patient limitations of imaging.  Medications/Fluids: Ordered: IM Toradol.  Will also give sling to use for comfort.  Discussed with patient that she needs to not wear this 24 hours a day and take her arm out, stretch and range the shoulder several times a day to help prevent stiffness and atrophy.  Most recent vital signs reviewed and are as follows: BP (!) 144/80 (BP Location: Right Arm)   Pulse 67   Temp 97.8 F (36.6 C)   Resp 18   Ht 5\' 6"  (1.676 m)   Wt 101.2 kg   LMP 03/27/2012   SpO2 98%   BMI 35.99 kg/m   Initial impression: Chronic left shoulder pain.  Discussed expectations of visit today with patient.  Discussed necessity of following back up with orthopedist.  She requests another orthopedic referral today.  3:35 PM Reassessment performed. Patient appears stable.  Imaging personally visualized and interpreted including: X-ray of the shoulder, agree negative for acute findings.  Reviewed pertinent lab work and imaging with patient at bedside. Questions answered.   Most current vital signs reviewed and are as follows: BP (!) 144/80 (BP Location: Right Arm)   Pulse 67   Temp 97.8 F (36.6 C)   Resp 18   Ht 5\' 6"  (1.676 m)   Wt 101.2 kg   LMP 03/27/2012   SpO2 98%   BMI 35.99 kg/m   Plan: Discharge to home.   Prescriptions written for: None  Other home care instructions discussed: Use of sling for comfort, continued use of home medications, orthopedic follow-up.  ED return instructions discussed: Worsening pain, swelling, new symptoms or other concerns, new numbness, weakness, tingling or pain of the left upper extremity.  Follow-up instructions discussed: Patient encouraged to follow-up  with their orthopedist or orthopedic referral in 1 week.                          Medical Decision Making Amount and/or Complexity of Data Reviewed Radiology: ordered.  Risk Prescription drug management.   Patient with chronic left shoulder pain.  Left upper extremity is neurovascularly intact today.  Patient is focally tender over the anterior and lateral shoulder.  No redness or swelling.  X-ray shows chronic findings including arthritis and tendinosis.  She was given a shot of NSAIDs here for discomfort.  Also provided with a sling.  Unfortunately, this is a more chronic  problem for the patient.  She will need to continue orthopedic follow-up as outpatient.  The patient's vital signs, pertinent lab work and imaging were reviewed and interpreted as discussed in the ED course. Hospitalization was considered for further testing, treatments, or serial exams/observation. However as patient is well-appearing, has a stable exam, and reassuring studies today, I do not feel that they warrant admission at this time. This plan was discussed with the patient who verbalizes agreement and comfort with this plan and seems reliable and able to return to the Emergency Department with worsening or changing symptoms.          Final Clinical Impression(s) / ED Diagnoses Final diagnoses:  Chronic left shoulder pain    Rx / DC Orders ED Discharge Orders     None         Renne Crigler, PA-C 11/07/22 1538    Pricilla Loveless, MD 11/11/22 1515

## 2022-11-07 NOTE — Discharge Instructions (Signed)
Please read and follow all provided instructions.  Your diagnoses today include:  1. Chronic left shoulder pain     Tests performed today include: An x-ray of the affected area - does NOT show any broken bones, show some arthritis Vital signs. See below for your results today.   Medications prescribed:  Use your home medications as prescribed.  Take any prescribed medications only as directed.  Home care instructions:  Follow any educational materials contained in this packet Follow R.I.C.E. Protocol: R - rest your injury  I  - use ice on injury without applying directly to skin C - compress injury with bandage or splint E - elevate the injury as much as possible  Follow-up instructions: Please follow-up with your primary care provider or the provided orthopedic physician (bone specialist) if you continue to have significant pain in 1 week.   Return instructions:  Please return if your fingers are numb or tingling, appear gray or blue, or you have severe pain (also elevate the arm and loosen splint or wrap if you were given one) Please return to the Emergency Department if you experience worsening symptoms.  Please return if you have any other emergent concerns.  Additional Information:  Your vital signs today were: BP (!) 144/80 (BP Location: Right Arm)   Pulse 67   Temp 97.8 F (36.6 C)   Resp 18   Ht 5\' 6"  (1.676 m)   Wt 101.2 kg   LMP 03/27/2012   SpO2 98%   BMI 35.99 kg/m  If your blood pressure (BP) was elevated above 135/85 this visit, please have this repeated by your doctor within one month. --------------

## 2022-11-07 NOTE — ED Triage Notes (Signed)
Pt fell in March 2023 and hurt left shoulder and the pain is getting worse. Pt states "the pills are not working fast enough and the shot made the pain worse" pt had steroid injection on 10/18/22. Pt took tramadol at 11am

## 2023-02-09 DIAGNOSIS — Z23 Encounter for immunization: Secondary | ICD-10-CM | POA: Diagnosis not present

## 2023-02-09 DIAGNOSIS — G478 Other sleep disorders: Secondary | ICD-10-CM | POA: Diagnosis not present

## 2023-02-09 DIAGNOSIS — I10 Essential (primary) hypertension: Secondary | ICD-10-CM | POA: Diagnosis not present

## 2023-02-09 DIAGNOSIS — G479 Sleep disorder, unspecified: Secondary | ICD-10-CM | POA: Diagnosis not present

## 2023-02-09 DIAGNOSIS — R739 Hyperglycemia, unspecified: Secondary | ICD-10-CM | POA: Diagnosis not present

## 2023-03-02 DIAGNOSIS — M79641 Pain in right hand: Secondary | ICD-10-CM | POA: Diagnosis not present

## 2023-03-02 DIAGNOSIS — F411 Generalized anxiety disorder: Secondary | ICD-10-CM | POA: Diagnosis not present

## 2023-03-23 DIAGNOSIS — I10 Essential (primary) hypertension: Secondary | ICD-10-CM | POA: Diagnosis not present

## 2023-03-23 DIAGNOSIS — G4733 Obstructive sleep apnea (adult) (pediatric): Secondary | ICD-10-CM | POA: Diagnosis not present

## 2023-03-23 DIAGNOSIS — G4726 Circadian rhythm sleep disorder, shift work type: Secondary | ICD-10-CM | POA: Diagnosis not present

## 2023-04-30 DIAGNOSIS — G4733 Obstructive sleep apnea (adult) (pediatric): Secondary | ICD-10-CM | POA: Diagnosis not present

## 2023-06-02 DIAGNOSIS — G4733 Obstructive sleep apnea (adult) (pediatric): Secondary | ICD-10-CM | POA: Diagnosis not present

## 2023-07-28 DIAGNOSIS — G4733 Obstructive sleep apnea (adult) (pediatric): Secondary | ICD-10-CM | POA: Diagnosis not present

## 2023-08-24 DIAGNOSIS — F411 Generalized anxiety disorder: Secondary | ICD-10-CM | POA: Diagnosis not present

## 2023-08-24 DIAGNOSIS — G4733 Obstructive sleep apnea (adult) (pediatric): Secondary | ICD-10-CM | POA: Diagnosis not present

## 2023-08-24 DIAGNOSIS — I10 Essential (primary) hypertension: Secondary | ICD-10-CM | POA: Diagnosis not present

## 2023-08-24 DIAGNOSIS — Z23 Encounter for immunization: Secondary | ICD-10-CM | POA: Diagnosis not present

## 2023-08-24 DIAGNOSIS — R7303 Prediabetes: Secondary | ICD-10-CM | POA: Diagnosis not present

## 2023-09-03 DIAGNOSIS — G4733 Obstructive sleep apnea (adult) (pediatric): Secondary | ICD-10-CM | POA: Diagnosis not present

## 2023-10-05 DIAGNOSIS — G4733 Obstructive sleep apnea (adult) (pediatric): Secondary | ICD-10-CM | POA: Diagnosis not present

## 2024-01-08 DIAGNOSIS — R0981 Nasal congestion: Secondary | ICD-10-CM | POA: Diagnosis not present

## 2024-01-08 DIAGNOSIS — R051 Acute cough: Secondary | ICD-10-CM | POA: Diagnosis not present

## 2024-02-02 DIAGNOSIS — R12 Heartburn: Secondary | ICD-10-CM | POA: Diagnosis not present

## 2024-02-02 DIAGNOSIS — R1310 Dysphagia, unspecified: Secondary | ICD-10-CM | POA: Diagnosis not present

## 2024-02-02 DIAGNOSIS — R09A2 Foreign body sensation, throat: Secondary | ICD-10-CM | POA: Diagnosis not present

## 2024-02-10 DIAGNOSIS — R053 Chronic cough: Secondary | ICD-10-CM | POA: Diagnosis not present

## 2024-02-10 DIAGNOSIS — Z03818 Encounter for observation for suspected exposure to other biological agents ruled out: Secondary | ICD-10-CM | POA: Diagnosis not present

## 2024-02-17 DIAGNOSIS — G4733 Obstructive sleep apnea (adult) (pediatric): Secondary | ICD-10-CM | POA: Diagnosis not present

## 2024-02-26 DIAGNOSIS — Z1211 Encounter for screening for malignant neoplasm of colon: Secondary | ICD-10-CM | POA: Diagnosis not present

## 2024-02-26 DIAGNOSIS — K319 Disease of stomach and duodenum, unspecified: Secondary | ICD-10-CM | POA: Diagnosis not present

## 2024-02-26 DIAGNOSIS — K5730 Diverticulosis of large intestine without perforation or abscess without bleeding: Secondary | ICD-10-CM | POA: Diagnosis not present

## 2024-02-26 DIAGNOSIS — K2100 Gastro-esophageal reflux disease with esophagitis, without bleeding: Secondary | ICD-10-CM | POA: Diagnosis not present

## 2024-02-26 DIAGNOSIS — R1310 Dysphagia, unspecified: Secondary | ICD-10-CM | POA: Diagnosis not present

## 2024-02-26 DIAGNOSIS — K635 Polyp of colon: Secondary | ICD-10-CM | POA: Diagnosis not present

## 2024-02-26 DIAGNOSIS — K2970 Gastritis, unspecified, without bleeding: Secondary | ICD-10-CM | POA: Diagnosis not present

## 2024-02-26 DIAGNOSIS — K2289 Other specified disease of esophagus: Secondary | ICD-10-CM | POA: Diagnosis not present

## 2024-02-26 DIAGNOSIS — D125 Benign neoplasm of sigmoid colon: Secondary | ICD-10-CM | POA: Diagnosis not present

## 2024-02-26 DIAGNOSIS — K219 Gastro-esophageal reflux disease without esophagitis: Secondary | ICD-10-CM | POA: Diagnosis not present

## 2024-03-03 DIAGNOSIS — R7303 Prediabetes: Secondary | ICD-10-CM | POA: Diagnosis not present

## 2024-03-03 DIAGNOSIS — Z6837 Body mass index (BMI) 37.0-37.9, adult: Secondary | ICD-10-CM | POA: Diagnosis not present

## 2024-03-03 DIAGNOSIS — G4733 Obstructive sleep apnea (adult) (pediatric): Secondary | ICD-10-CM | POA: Diagnosis not present

## 2024-03-03 DIAGNOSIS — I10 Essential (primary) hypertension: Secondary | ICD-10-CM | POA: Diagnosis not present

## 2024-03-03 DIAGNOSIS — F411 Generalized anxiety disorder: Secondary | ICD-10-CM | POA: Diagnosis not present

## 2024-03-03 DIAGNOSIS — G479 Sleep disorder, unspecified: Secondary | ICD-10-CM | POA: Diagnosis not present

## 2024-05-13 DIAGNOSIS — R202 Paresthesia of skin: Secondary | ICD-10-CM | POA: Diagnosis not present

## 2024-05-13 DIAGNOSIS — M25519 Pain in unspecified shoulder: Secondary | ICD-10-CM | POA: Diagnosis not present

## 2024-07-12 DIAGNOSIS — R7303 Prediabetes: Secondary | ICD-10-CM | POA: Diagnosis not present

## 2024-07-12 DIAGNOSIS — M25519 Pain in unspecified shoulder: Secondary | ICD-10-CM | POA: Diagnosis not present

## 2024-07-12 DIAGNOSIS — Z1331 Encounter for screening for depression: Secondary | ICD-10-CM | POA: Diagnosis not present

## 2024-07-12 DIAGNOSIS — G478 Other sleep disorders: Secondary | ICD-10-CM | POA: Diagnosis not present

## 2024-07-12 DIAGNOSIS — I10 Essential (primary) hypertension: Secondary | ICD-10-CM | POA: Diagnosis not present

## 2024-07-12 DIAGNOSIS — R5383 Other fatigue: Secondary | ICD-10-CM | POA: Diagnosis not present

## 2024-08-11 ENCOUNTER — Telehealth: Payer: Self-pay | Admitting: Neurology

## 2024-08-11 NOTE — Telephone Encounter (Signed)
 Patient called to verify appointment and copay. Informed patient copay would be $50.

## 2024-09-13 DIAGNOSIS — R202 Paresthesia of skin: Secondary | ICD-10-CM | POA: Diagnosis not present

## 2024-09-13 DIAGNOSIS — I10 Essential (primary) hypertension: Secondary | ICD-10-CM | POA: Diagnosis not present

## 2024-09-13 DIAGNOSIS — M79641 Pain in right hand: Secondary | ICD-10-CM | POA: Diagnosis not present

## 2024-09-20 ENCOUNTER — Encounter: Payer: Self-pay | Admitting: Neurology

## 2024-09-20 ENCOUNTER — Ambulatory Visit: Admitting: Neurology

## 2024-09-20 VITALS — BP 113/61 | HR 64 | Ht 65.0 in | Wt 234.0 lb

## 2024-09-20 DIAGNOSIS — M79642 Pain in left hand: Secondary | ICD-10-CM | POA: Diagnosis not present

## 2024-09-20 DIAGNOSIS — M25512 Pain in left shoulder: Secondary | ICD-10-CM

## 2024-09-20 DIAGNOSIS — M542 Cervicalgia: Secondary | ICD-10-CM

## 2024-09-20 DIAGNOSIS — R202 Paresthesia of skin: Secondary | ICD-10-CM

## 2024-09-20 DIAGNOSIS — R200 Anesthesia of skin: Secondary | ICD-10-CM | POA: Diagnosis not present

## 2024-09-20 DIAGNOSIS — G8929 Other chronic pain: Secondary | ICD-10-CM
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
## 2024-09-20 NOTE — Patient Instructions (Signed)
 It was nice to meet you today.   As discussed, your headaches are likely due to a combination of factors.   Here is what we discussed today and my recommendations for you:   Please talk to your primary care about seeing a hand surgeon for your hand pain, you may have arthritis and also possibility of carpal tunnel but my suspicion is that you have arthritis in both hands.   Please be cautious with daily Tylenol  use 3 times a day as chronic use of Tylenol  can affect your liver function.   We will do a neck MRI to look for arthritis in the neck and pinched nerve type findings which we call radiculopathy which may affect her upper back and shoulder areas with radiating pain. We will do an EMG and nerve conduction velocity test, which is an electrical nerve and muscle test, which we will schedule. We will call you with the results. For now, we will keep you posted as to your test results by phone call and follow-up in this clinic as needed.  Neurologically, your exam is quite good, no obvious sign of weakness or nerve damage, strength is good as well.

## 2024-10-17 DIAGNOSIS — M7552 Bursitis of left shoulder: Secondary | ICD-10-CM | POA: Diagnosis not present

## 2024-10-17 DIAGNOSIS — M70812 Other soft tissue disorders related to use, overuse and pressure, left shoulder: Secondary | ICD-10-CM | POA: Diagnosis not present

## 2024-10-20 DIAGNOSIS — R202 Paresthesia of skin: Secondary | ICD-10-CM | POA: Diagnosis not present

## 2024-10-20 DIAGNOSIS — Z1231 Encounter for screening mammogram for malignant neoplasm of breast: Secondary | ICD-10-CM | POA: Diagnosis not present

## 2024-10-20 DIAGNOSIS — M79641 Pain in right hand: Secondary | ICD-10-CM | POA: Diagnosis not present

## 2024-10-20 DIAGNOSIS — G4733 Obstructive sleep apnea (adult) (pediatric): Secondary | ICD-10-CM | POA: Diagnosis not present

## 2024-10-20 DIAGNOSIS — I10 Essential (primary) hypertension: Secondary | ICD-10-CM | POA: Diagnosis not present

## 2024-10-26 DIAGNOSIS — M25512 Pain in left shoulder: Secondary | ICD-10-CM | POA: Diagnosis not present

## 2024-11-02 ENCOUNTER — Ambulatory Visit (INDEPENDENT_AMBULATORY_CARE_PROVIDER_SITE_OTHER): Admitting: Neurology

## 2024-11-02 DIAGNOSIS — M25512 Pain in left shoulder: Secondary | ICD-10-CM | POA: Diagnosis not present

## 2024-11-02 DIAGNOSIS — M79642 Pain in left hand: Secondary | ICD-10-CM | POA: Diagnosis not present

## 2024-11-02 DIAGNOSIS — R202 Paresthesia of skin: Secondary | ICD-10-CM

## 2024-11-02 DIAGNOSIS — G8929 Other chronic pain: Secondary | ICD-10-CM

## 2024-11-02 DIAGNOSIS — R200 Anesthesia of skin: Secondary | ICD-10-CM | POA: Diagnosis not present

## 2024-11-02 NOTE — Procedures (Signed)
 Full Name: Pamela Miles Gender: Female MRN #: [id number] Date of Birth: [birth year]    Visit Date: 11/02/2024 09:12 Age: 63 Years Examining Physician: Onita Duos Referring Physician: Buck Height: 5 feet 6 inch History: 63 year old right-handed female complains of left shoulder pain, despite left rotator cuff surgery last year, intermittent bilateral finger and feet paresthesia  Summary of the test: Nerve conduction study: Left sural and superficial peroneal, bilateral ulnar sensory responses were normal.  Bilateral median sensory responses showed mildly prolonged peak latency with well-preserved snap amplitude.  Left tibial, bilateral median, ulnar motor responses were normal  Electromyography: Select needle examination of left upper limb to muscle cervical paraspinal muscles were normal.  Conclusion: This is a mild abnormal study.  There is electrodiagnostic evidence of bilateral distal median across the wrist consistent with mild bilateral carpal tunnel syndromes.  There was no evidence of left cervical radiculopathy.    ------------------------------- Duos Onita. M.D. Ph.D.   [facility] [address] [place], [place] [address] Tel: [phone] Fax: [phone]  Verbal informed consent was obtained from the patient, patient was informed of potential risk of procedure, including bruising, bleeding, hematoma formation, infection, muscle weakness, muscle pain, numbness, among others.        MNC    Nerve / Sites Muscle Latency Ref. Amplitude Ref. Rel Amp Segments Distance Velocity Ref. Area    ms ms mV mV %  cm m/s m/s mVms  R Median - APB     Wrist APB 4.2 <=4.4 8.0 >=4.0 100 Wrist - APB 7   24.7     Upper arm APB 8.6  7.4  93 Upper arm - Wrist 24 55 >=49 25.1  L Median - APB     Wrist APB 4.1 <=4.4 9.2 >=4.0 100 Wrist - APB 7   26.8     Upper arm APB 8.3  6.7  72.9 Upper arm - Wrist 25 59 >=49 23.1  R Ulnar - ADM     Wrist ADM  2.7 <=3.3 13.9 >=6.0 100 Wrist - ADM 7   32.6     B.Elbow ADM 5.2  12.8  92.2 B.Elbow - Wrist 15 60 >=49 32.7     A.Elbow ADM 7.4  12.9  101 A.Elbow - B.Elbow 12 55 >=49 34.0  L Ulnar - ADM     Wrist ADM 2.5 <=3.3 12.0 >=6.0 100 Wrist - ADM 7   31.3     B.Elbow ADM 5.5  11.5  96 B.Elbow - Wrist 16 54 >=49 30.0     A.Elbow ADM 7.5  11.3  97.5 A.Elbow - B.Elbow 11 53 >=49 31.1  L Tibial - AH     Ankle AH 3.8 <=5.8 7.8 >=4.0 100 Ankle - AH 9   10.0     Pop fossa AH 12.4  5.6  71.4 Pop fossa - Ankle 50 58 >=41 8.9               SNC    Nerve / Sites Rec. Site Peak Lat Ref.  Amp Ref. Segments Distance    ms ms V V  cm  L Sural - Ankle (Calf)     Calf Ankle 3.8 <=4.4 12 >=6 Calf - Ankle 14  L Superficial peroneal - Ankle     Lat leg Ankle 3.7 <=4.4 6 >=6 Lat leg - Ankle 14  L Median - Orthodromic (Dig II, Mid palm)     Dig II Wrist 3.8 <=3.4  24 >=10 Dig II - Wrist 13  R Median - Orthodromic (Dig II, Mid palm)     Dig II Wrist 3.5 <=3.4 18 >=10 Dig II - Wrist 13  L Ulnar - Orthodromic, (Dig V, Mid palm)     Dig V Wrist 2.6 <=3.1 7 >=5 Dig V - Wrist 11  R Ulnar - Orthodromic, (Dig V, Mid palm)     Dig V Wrist 3.0 <=3.1 29 >=5 Dig V - Wrist 36                 F  Wave    Nerve F Lat Ref.   ms ms  R Ulnar - ADM 27.6 <=32.0  L Ulnar - ADM 26.7 <=32.0  L Tibial - AH 52.0 <=56.0           EMG Summary Table    Spontaneous MUAP Recruitment  Muscle IA Fib PSW Fasc Other Amp Dur. Poly Pattern  L. First dorsal interosseous Normal None None None _______ Normal Normal Normal Normal  L. Triceps brachii Normal None None None _______ Normal Normal Normal Normal  L. Deltoid Normal None None None _______ Normal Normal Normal Normal  L. Biceps brachii Normal None None None _______ Normal Normal Normal Normal  L. Extensor digitorum communis Normal None None None _______ Normal Normal Normal Normal  L. Brachioradialis Normal None None None _______ Normal Normal Normal Normal  L. Cervical paraspinals  Normal None None None _______ Normal Normal Normal Normal

## 2024-11-02 NOTE — Progress Notes (Signed)
 Chief Complaint  Patient presents with   Follow-up    Pt in EMG room 3. Alone. Here for NCS.      ASSESSMENT AND PLAN  Pamela Miles is a 63 y.o. female   Left shoulder pain, Intermittent bilateral hands and feet paresthesia,  EMG nerve conduction study today showed no evidence of large fiber peripheral neuropathy only mild bilateral carpal tunnel syndromes.  There is no evidence of active left cervical radiculopathy  She has significant tenderness of left shoulder region, most consistent with shoulder pathology, suggested warm compression, shoulder stretching, as needed NSAIDs, may continue follow-up with her orthopedic surgeon  DIAGNOSTIC DATA (LABS, IMAGING, TESTING) - I reviewed patient records, labs, notes, testing and imaging myself where available.  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 Buck for electrodiagnostic evaluation of intermittent bilateral hands feet paresthesia left shoulder pain   History is obtained from the patient and review of electronic medical records. I personally reviewed pertinent available imaging films in PACS.   PMHx of Obesity Obstructive sleep apnea Hypertension Diabetes Anxiety History of bilateral carpal tunnel release surgery, left rotator cuff surgery  Patient reported that she had experience of electrocuted at her work around Spring of 2023, since then she had a frequent left shoulder discomfort, eventually had left rotator cuff surgery in the spring 2024, which provide partial relief, she continued to have left shoulder discomfort, intermittent left neck muscle tightness,  She had a history of bilateral carpal tunnel release surgery in the past, still have intermittent bilateral fingertips paresthesia, foot paresthesia  Denied gait  abnormality denied persistent motor deficit   PHYSICAL EXAM:   Vitals:   11/02/24 0832  BP: 127/80  Pulse: 72  SpO2: 96%     There is no height or weight on file to calculate BMI.  PHYSICAL EXAMNIATION:  Gen: NAD, conversant, well nourised, well groomed                     Cardiovascular: Regular rate rhythm, no peripheral edema, warm, nontender. Eyes: Conjunctivae clear without exudates or hemorrhage Neck: Supple, no carotid bruits. Pulmonary: Clear to auscultation bilaterally   NEUROLOGICAL EXAM:  MENTAL STATUS: Speech/cognition: Awake, alert, oriented to history taking and casual conversation CRANIAL NERVES: CN II: Visual fields are full to confrontation. Pupils are round equal and briskly reactive to light. CN III, IV, VI: extraocular movement are normal. No ptosis. CN V: Facial sensation is intact to light touch CN VII: Face is symmetric with normal eye closure  CN VIII: Hearing is normal to causal conversation. CN IX, X: Phonation is normal. CN XI: Head turning and shoulder shrug are intact  MOTOR: There is no pronator drift of out-stretched arms. Muscle bulk and tone are normal. Muscle strength is normal.  Significant tenderness at the left anterior shoulder region  REFLEXES: Reflexes are 2+ and symmetric at the biceps, triceps, knees, and ankles. Plantar responses are flexor.  SENSORY: Intact to light touch, pinprick and vibratory sensation are intact in fingers and toes.  COORDINATION: There is no trunk or limb dysmetria noted.  GAIT/STANCE: Posture is normal. Gait is steady    REVIEW OF SYSTEMS:  Full 14 system review of systems performed and notable only for as above All other review of systems were negative.   ALLERGIES: No Known Allergies  HOME  MEDICATIONS: Current Outpatient Medications  Medication Sig Dispense Refill   metFORMIN (GLUCOPHAGE-XR) 500 MG 24 hr tablet SMARTSIG:1 Tablet(s) By Mouth Every Evening     omeprazole (PRILOSEC) 20 MG  capsule Take 20 mg by mouth daily.     valsartan-hydrochlorothiazide (DIOVAN-HCT) 160-12.5 MG tablet Take 1 tablet by mouth daily.     verapamil (CALAN-SR) 240 MG CR tablet Take 240 mg by mouth at bedtime.     ALPRAZolam (XANAX) 0.5 MG tablet Take 0.5 mg by mouth 4 (four) times daily as needed. (Patient not taking: Reported on 11/02/2024)     metoprolol  tartrate (LOPRESSOR ) 50 MG tablet Take one tablet by mouth 2 hours prior to your CT scan (Patient not taking: Reported on 11/02/2024) 1 tablet 0   valsartan (DIOVAN) 80 MG tablet Take 80 mg by mouth daily. (Patient not taking: Reported on 11/02/2024)     Vitamin D, Ergocalciferol, (DRISDOL) 1.25 MG (50000 UNIT) CAPS capsule Take 50,000 Units by mouth once a week. (Patient not taking: Reported on 11/02/2024)     No current facility-administered medications for this visit.    PAST MEDICAL HISTORY: Past Medical History:  Diagnosis Date   Chest tightness    COVID    DDD (degenerative disc disease), lumbar    Excessive daytime sleepiness    Facet arthropathy    Hypertension    Memory impairment    Muscle spasm    OSA (obstructive sleep apnea)    Sleep disorder    Varicose veins of bilateral lower extremities with pain    Wheezing     PAST SURGICAL HISTORY: No past surgical history on file.  FAMILY HISTORY: Family History  Problem Relation Age of Onset   Hypertension Mother    Sudden death Neg Hx    Diabetes Neg Hx    Heart attack Neg Hx    Hyperlipidemia Neg Hx     SOCIAL HISTORY: Social History   Socioeconomic History   Marital status: Married    Spouse name: Not on file   Number of children: Not on file   Years of education: Not on file   Highest education level: Not on file  Occupational History   Not on file  Tobacco Use   Smoking status: Never   Smokeless tobacco: Never  Substance and Sexual Activity   Alcohol use: Yes    Comment: occ glass wine   Drug use: No   Sexual activity: Not on file  Other Topics  Concern   Not on file  Social History Narrative   Pt lives with family    Pt works    Social Drivers of Corporate Investment Banker Strain: Not on file  Food Insecurity: Not on file  Transportation Needs: Not on file  Physical Activity: Not on file  Stress: Not on file  Social Connections: Not on file  Intimate Partner Violence: Not on file      Modena Callander, M.D. Ph.D.  [facility] [address] [place], [place] [address] Ph: [phone] Fax: [phone]  CC:  No referring provider defined for this encounter.  Cyrena Gwenn SQUIBB, MD (Inactive)

## 2024-11-07 ENCOUNTER — Ambulatory Visit: Payer: Self-pay | Admitting: Neurology

## 2024-11-07 DIAGNOSIS — M7552 Bursitis of left shoulder: Secondary | ICD-10-CM | POA: Diagnosis not present

## 2024-11-07 DIAGNOSIS — M19012 Primary osteoarthritis, left shoulder: Secondary | ICD-10-CM | POA: Diagnosis not present

## 2024-11-08 NOTE — Telephone Encounter (Signed)
 Pt called back returning call informed she will get a callback

## 2024-11-08 NOTE — Telephone Encounter (Addendum)
LVM for patient to call back to review test results

## 2024-11-08 NOTE — Telephone Encounter (Addendum)
 Spoke to patient gave  EMG results Gave Dr Amelia recommendations . Pt declined referral for hand surgeon Pt states has orthopedic will see them next . Pt request for EMG results to be sent to her home . Verified address and will send to medical to mail to patient home .

## 2024-11-08 NOTE — Telephone Encounter (Signed)
-----   Message from Nurse Heather C sent at 11/07/2024  5:27 PM EST -----  ----- Message ----- From: Buck Saucer, MD Sent: 11/07/2024  12:49 PM EST To: G[facility] 4 Results  Please call patient and advise her that her recent EMG and nerve conduction velocity test through our office which is the electrical nerve and muscle test that Dr. Onita performed, showed no pinched  nerve type findings from the neck, which is good news.  She has evidence of mild carpal tunnel syndrome which is a pinched nerve at the wrist, she has this on both sides.  It may help to use wrist  splints such as Velcro type wrist support splints overnight.  She can get these over-the-counter.  For mild carpal tunnel we typically do not recommend any surgical intervention but if she would like  to get an opinion from a hand surgeon, we can certainly facilitate with a referral if she desires.  Let me know and we can refer her to hand surgery. ----- Message ----- From: Onita Duos, MD Sent: 11/02/2024   9:28 AM EST To: Saucer Buck, MD
